# Patient Record
Sex: Female | Born: 1950 | Race: White | Hispanic: No | Smoking: Never smoker
Health system: Southern US, Community
[De-identification: ages and names within clinical notes are randomized; demographics above are authoritative.]

## PROBLEM LIST (undated history)

## (undated) DIAGNOSIS — I10 Essential (primary) hypertension: Secondary | ICD-10-CM

## (undated) DIAGNOSIS — E119 Type 2 diabetes mellitus without complications: Secondary | ICD-10-CM

## (undated) DIAGNOSIS — C7A8 Other malignant neuroendocrine tumors: Secondary | ICD-10-CM

## (undated) DIAGNOSIS — E7800 Pure hypercholesterolemia, unspecified: Secondary | ICD-10-CM

## (undated) DIAGNOSIS — G4730 Sleep apnea, unspecified: Secondary | ICD-10-CM

## (undated) DIAGNOSIS — K589 Irritable bowel syndrome without diarrhea: Secondary | ICD-10-CM

## (undated) HISTORY — PX: TONSILLECTOMY: SUR1361

## (undated) HISTORY — PX: ANKLE FRACTURE SURGERY: SHX122

## (undated) HISTORY — PX: TUBAL LIGATION: SHX77

## (undated) HISTORY — PX: CHOLECYSTECTOMY: SHX55

---

## 2009-08-27 ENCOUNTER — Ambulatory Visit (HOSPITAL_COMMUNITY): Admission: RE | Admit: 2009-08-27 | Discharge: 2009-08-27 | Payer: Self-pay | Admitting: Urology

## 2010-06-07 LAB — GLUCOSE, CAPILLARY: Glucose-Capillary: 171 mg/dL — ABNORMAL HIGH (ref 70–99)

## 2014-01-26 ENCOUNTER — Encounter (HOSPITAL_BASED_OUTPATIENT_CLINIC_OR_DEPARTMENT_OTHER): Payer: Self-pay | Admitting: *Deleted

## 2014-01-26 ENCOUNTER — Emergency Department (HOSPITAL_BASED_OUTPATIENT_CLINIC_OR_DEPARTMENT_OTHER): Payer: Managed Care, Other (non HMO)

## 2014-01-26 ENCOUNTER — Emergency Department (HOSPITAL_BASED_OUTPATIENT_CLINIC_OR_DEPARTMENT_OTHER)
Admission: EM | Admit: 2014-01-26 | Discharge: 2014-01-26 | Disposition: A | Discharge status: Other Acute Inpatient Hospital | Payer: Managed Care, Other (non HMO) | Attending: Emergency Medicine | Admitting: Emergency Medicine

## 2014-01-26 DIAGNOSIS — E780 Pure hypercholesterolemia: Secondary | ICD-10-CM | POA: Insufficient documentation

## 2014-01-26 DIAGNOSIS — K5289 Other specified noninfective gastroenteritis and colitis: Secondary | ICD-10-CM | POA: Insufficient documentation

## 2014-01-26 DIAGNOSIS — Z794 Long term (current) use of insulin: Secondary | ICD-10-CM | POA: Diagnosis not present

## 2014-01-26 DIAGNOSIS — E119 Type 2 diabetes mellitus without complications: Secondary | ICD-10-CM | POA: Diagnosis not present

## 2014-01-26 DIAGNOSIS — Z79899 Other long term (current) drug therapy: Secondary | ICD-10-CM | POA: Insufficient documentation

## 2014-01-26 DIAGNOSIS — I10 Essential (primary) hypertension: Secondary | ICD-10-CM | POA: Insufficient documentation

## 2014-01-26 DIAGNOSIS — R197 Diarrhea, unspecified: Secondary | ICD-10-CM | POA: Diagnosis present

## 2014-01-26 DIAGNOSIS — K529 Noninfective gastroenteritis and colitis, unspecified: Secondary | ICD-10-CM

## 2014-01-26 DIAGNOSIS — R109 Unspecified abdominal pain: Secondary | ICD-10-CM | POA: Insufficient documentation

## 2014-01-26 HISTORY — DX: Type 2 diabetes mellitus without complications: E11.9

## 2014-01-26 HISTORY — DX: Essential (primary) hypertension: I10

## 2014-01-26 HISTORY — DX: Irritable bowel syndrome, unspecified: K58.9

## 2014-01-26 HISTORY — DX: Sleep apnea, unspecified: G47.30

## 2014-01-26 HISTORY — DX: Pure hypercholesterolemia, unspecified: E78.00

## 2014-01-26 LAB — URINE MICROSCOPIC-ADD ON

## 2014-01-26 LAB — CBC WITH DIFFERENTIAL/PLATELET
Basophils Absolute: 0 10*3/uL (ref 0.0–0.1)
Basophils Relative: 0 % (ref 0–1)
EOS ABS: 0 10*3/uL (ref 0.0–0.7)
EOS PCT: 0 % (ref 0–5)
HEMATOCRIT: 38.5 % (ref 36.0–46.0)
Hemoglobin: 13.4 g/dL (ref 12.0–15.0)
Lymphocytes Relative: 8 % — ABNORMAL LOW (ref 12–46)
Lymphs Abs: 2 10*3/uL (ref 0.7–4.0)
MCH: 30.6 pg (ref 26.0–34.0)
MCHC: 34.8 g/dL (ref 30.0–36.0)
MCV: 87.9 fL (ref 78.0–100.0)
MONOS PCT: 8 % (ref 3–12)
Monocytes Absolute: 2.2 10*3/uL — ABNORMAL HIGH (ref 0.1–1.0)
Neutro Abs: 22.3 10*3/uL — ABNORMAL HIGH (ref 1.7–7.7)
Neutrophils Relative %: 84 % — ABNORMAL HIGH (ref 43–77)
PLATELETS: 283 10*3/uL (ref 150–400)
RBC: 4.38 MIL/uL (ref 3.87–5.11)
RDW: 12.8 % (ref 11.5–15.5)
WBC: 26.5 10*3/uL — ABNORMAL HIGH (ref 4.0–10.5)

## 2014-01-26 LAB — URINALYSIS, ROUTINE W REFLEX MICROSCOPIC
BILIRUBIN URINE: NEGATIVE
GLUCOSE, UA: NEGATIVE mg/dL
Ketones, ur: NEGATIVE mg/dL
Nitrite: NEGATIVE
PROTEIN: NEGATIVE mg/dL
SPECIFIC GRAVITY, URINE: 1.008 (ref 1.005–1.030)
UROBILINOGEN UA: 0.2 mg/dL (ref 0.0–1.0)
pH: 5 (ref 5.0–8.0)

## 2014-01-26 LAB — COMPREHENSIVE METABOLIC PANEL
ALK PHOS: 104 U/L (ref 39–117)
ALT: 14 U/L (ref 0–35)
ANION GAP: 15 (ref 5–15)
AST: 17 U/L (ref 0–37)
Albumin: 3.5 g/dL (ref 3.5–5.2)
BILIRUBIN TOTAL: 0.7 mg/dL (ref 0.3–1.2)
BUN: 35 mg/dL — AB (ref 6–23)
CALCIUM: 9.1 mg/dL (ref 8.4–10.5)
CO2: 25 meq/L (ref 19–32)
CREATININE: 1.9 mg/dL — AB (ref 0.50–1.10)
Chloride: 99 mEq/L (ref 96–112)
GFR, EST AFRICAN AMERICAN: 31 mL/min — AB (ref >90)
GFR, EST NON AFRICAN AMERICAN: 27 mL/min — AB (ref >90)
GLUCOSE: 223 mg/dL — AB (ref 70–99)
Potassium: 3.9 mEq/L (ref 3.7–5.3)
SODIUM: 139 meq/L (ref 137–147)
Total Protein: 6.9 g/dL (ref 6.0–8.3)

## 2014-01-26 MED ORDER — SODIUM CHLORIDE 0.9 % IV BOLUS (SEPSIS)
500.0000 mL | Freq: Once | INTRAVENOUS | Status: AC
Start: 2014-01-26 — End: 2014-01-26
  Administered 2014-01-26: 500 mL via INTRAVENOUS

## 2014-01-26 MED ORDER — MORPHINE SULFATE 4 MG/ML IJ SOLN
4.0000 mg | Freq: Once | INTRAMUSCULAR | Status: AC
  Administered 2014-01-26: 4 mg via INTRAVENOUS
  Filled 2014-01-26: qty 1

## 2014-01-26 MED ORDER — CIPROFLOXACIN IN D5W 400 MG/200ML IV SOLN
400.0000 mg | Freq: Once | INTRAVENOUS | Status: DC
  Filled 2014-01-26: qty 200

## 2014-01-26 MED ORDER — METRONIDAZOLE IN NACL 5-0.79 MG/ML-% IV SOLN
500.0000 mg | Freq: Once | INTRAVENOUS | Status: AC
  Administered 2014-01-26: 500 mg via INTRAVENOUS
  Filled 2014-01-26 (×2): qty 100

## 2014-01-26 MED ORDER — ONDANSETRON HCL 4 MG/2ML IJ SOLN
4.0000 mg | Freq: Once | INTRAMUSCULAR | Status: AC
  Administered 2014-01-26: 4 mg via INTRAVENOUS
  Filled 2014-01-26: qty 2

## 2014-01-26 MED ORDER — IOHEXOL 300 MG/ML  SOLN: 50.0000 mL | Freq: Once | INTRAMUSCULAR | Status: DC | PRN

## 2014-01-26 NOTE — ED Provider Notes (Signed)
CSN: [id number]     Arrival date & time 01/26/14  0175 History   First MD Initiated Contact with Patient 01/26/14 1020     Chief Complaint  Patient presents with  . Diarrhea     (Consider location/radiation/quality/duration/timing/severity/associated sxs/prior Treatment) HPI Comments: Patient is a 63 year old female with history of diabetes, hypertension, and incurable bowel. She presents today with complaints of left-sided abdominal cramping for the past 24 hours. She has had several bowel movements yesterday evening which were bloody. She reports having a fever at home. She denies ill contacts.  She tells me she had a colonoscopy less than 2 years ago which revealed that she had diverticulosis. She was advised to go on a diverticulosis friendly diet.  Patient is a 63 y.o. female presenting with diarrhea. The history is provided by the patient.  Diarrhea Quality:  Bloody Severity:  Moderate Onset quality:  Gradual Duration:  2 days Timing:  Constant Progression:  Worsening Relieved by:  Nothing Worsened by:  Nothing tried Ineffective treatments:  None tried Associated symptoms: abdominal pain and fever     Past Medical History  Diagnosis Date  . Irritable bowel syndrome   . Diabetes mellitus without complication   . Hypertension   . Sleep apnea   . High cholesterol    Past Surgical History  Procedure Laterality Date  . Cholecystectomy    . Tonsillectomy    . Tubal ligation    . Ankle fracture surgery     No family history on file. History  Substance Use Topics  . Smoking status: Never Smoker   . Smokeless tobacco: Not on file  . Alcohol Use: No   OB History    No data available     Review of Systems  Constitutional: Positive for fever.  Gastrointestinal: Positive for abdominal pain and diarrhea.  All other systems reviewed and are negative.     Allergies  Review of patient's allergies indicates no known allergies.  Home Medications   Prior to  Admission medications   Medication Sig Start Date End Date Taking? Authorizing Provider  insulin glargine (LANTUS) 100 UNIT/ML injection Inject into the skin at bedtime.   Yes Historical Provider, MD  lisinopril (PRINIVIL,ZESTRIL) 10 MG tablet Take 10 mg by mouth daily.   Yes Historical Provider, MD  simvastatin (ZOCOR) 10 MG tablet Take 10 mg by mouth daily.   Yes Historical Provider, MD  verapamil (CALAN) 120 MG tablet Take 120 mg by mouth 3 (three) times daily.   Yes Historical Provider, MD   BP 135/70 mmHg  Pulse 107  Temp(Src) 99 F (37.2 C) (Oral)  Resp 20  Ht 5\' 6"  (1.676 m)  Wt 230 lb (104.327 kg)  BMI 37.14 kg/m2  SpO2 97% Physical Exam  Constitutional: She is oriented to person, place, and time. She appears well-developed and well-nourished. No distress.  HENT:  Head: Normocephalic and atraumatic.  Neck: Normal range of motion. Neck supple.  Cardiovascular: Normal rate and regular rhythm.  Exam reveals no gallop and no friction rub.   No murmur heard. Pulmonary/Chest: Effort normal and breath sounds normal. No respiratory distress. She has no wheezes.  Abdominal: Soft. Bowel sounds are normal. She exhibits no distension. There is tenderness. There is no rebound and no guarding.  There is tenderness to palpation in the left lower quadrant and left midabdomen. There is no rebound and no guarding.  Musculoskeletal: Normal range of motion.  Neurological: She is alert and oriented to person, place, and time.  Skin: Skin is warm and dry. She is not diaphoretic.  Nursing note and vitals reviewed.   ED Course  Procedures (including critical care time) Labs Review Labs Reviewed  COMPREHENSIVE METABOLIC PANEL  CBC WITH DIFFERENTIAL  URINALYSIS, ROUTINE W REFLEX MICROSCOPIC    Imaging Review No results found.   Date: 01/27/2014  Rate: 73  Rhythm: sinus  QRS Axis: normal  Intervals: normal  ST/T Wave abnormalities: none  Conduction Disutrbances:none  Narrative  Interpretation:   Old EKG Reviewed: none available    MDM   Final diagnoses:  None    Patient presents with left sided abd pain and bloody stool.  Workup reveals an elevated wbc of 26k and ct shows colitis of the descending and sigmoid colon.  I feel these findings and clinical situation require iv antibiotics and admission.  The patient is requesting admission to [facility].  I have spoken with Dr. Doyne Keel who agrees to accept the patient in transfer.  I will order blood cultures, give cipro and flagyl, and transfer to [facility].    Veryl Speak, MD 01/27/14 [phone]

## 2014-01-26 NOTE — ED Notes (Signed)
Patient states last night began having abd pain with diarrhea. States that there was bright red blood present when she had a BM

## 2014-02-01 LAB — CULTURE, BLOOD (ROUTINE X 2)
Culture: NO GROWTH
Culture: NO GROWTH

## 2014-05-13 NOTE — Progress Notes (Signed)
Patient ID: Kristy Rios, female   DOB: Sep 15, 1950, 64 y.o.   MRN: [id number]     Cardiology Office Note   Date:  05/13/2014   ID:  SHANEDRA LAVE, DOB [birth year], MRN [id number]  PCP:  No primary care provider on file.  Cardiologist:   Jenkins Rouge, MD   No chief complaint on file.     History of Present Illness: Kristy Rios is a 64 y.o. female who presents for evaluation of ? Thrombosis seen on CT scan. Seen in ER at [facility] 01/26/14 with LLQ pain and CT showing IBS With elevated WBC  Transferred to [facility] hospital at patient request.  CT abdomen indicated minimal calcified plaque in the decending abdominal aorta Noted lower lobe Pulmonary nodules  She indicates she has none about these nodules since 2004.  They have been stable  Subsequently diagnosed with carcinoid.  Recently evaluated At [facility] records .  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  Also noted intracardiac filling defect ? Thrombus ? Enhancement ? Tumor involvement.  Present in November not FDG avid.    She has no dyspnea palpitations history of right heart failure flusshing or edema.  Prior to CT GFR 48 and has had diuretic held the last 5 days.  A1c 6.6      Past Medical History  Diagnosis Date  . Irritable bowel syndrome   . Diabetes mellitus without complication   . Hypertension   . Sleep apnea   . High cholesterol     Past Surgical History  Procedure Laterality Date  . Cholecystectomy    . Tonsillectomy    . Tubal ligation    . Ankle fracture surgery       Current Outpatient Prescriptions  Medication Sig Dispense Refill  . insulin glargine (LANTUS) 100 UNIT/ML injection Inject into the skin at bedtime.    Marland Kitchen lisinopril (PRINIVIL,ZESTRIL) 10 MG tablet Take 10 mg by mouth daily.    . simvastatin (ZOCOR) 10 MG tablet Take 10 mg by mouth daily.    . verapamil (CALAN) 120 MG tablet Take 120 mg by mouth 3 (three) times daily.     No  current facility-administered medications for this visit.    Allergies:   Review of patient's allergies indicates no known allergies.    Social History:  The patient  reports that she has never smoked. She does not have any smokeless tobacco history on file. She reports that she does not drink alcohol.   Family History:  The patient's family history is not on file.    ROS:  Please see the history of present illness.   Otherwise, review of systems are positive for none.   All other systems are reviewed and negative.    PHYSICAL EXAM: VS:  There were no vitals taken for this visit. , BMI There is no weight on file to calculate BMI. Obese white female  HEENT: normal Neck: no JVD, carotid bruits, or masses Cardiac:  RRR; no murmurs, rubs, or gallops,no edema  Respiratory:  clear to auscultation bilaterally, normal work of breathing GI: soft, nontender, nondistended, + BS MS: no deformity or atrophy Skin: warm and dry, no rash Neuro:  Strength and sensation are intact Psych: euthymic mood, full affect   EKG:  Report MD Ouida Sills NSR    Recent Labs: 01/26/2014: ALT 14; BUN 35*; Creatinine 1.90*; Hemoglobin 13.4; Platelets 283; Potassium 3.9; Sodium 139    Lipid Panel No results found  for: CHOL, TRIG, HDL, CHOLHDL, VLDL, LDLCALC, LDLDIRECT    Wt Readings from Last 3 Encounters:  01/26/14 104.327 kg (230 lb)      Other studies Reviewed: Additional studies/ records that were reviewed today include: Fax records [facility] Sills and EPIC notes.    ASSESSMENT AND PLAN:  1.  Cardiac Mass:  Need to characterize as Rx of thrombus would involve anticoagulation.  Agree that cardiac MRI would be best test.  Suspect her Cr will be ok since holding diuretic but will check BMET as need gadolinium to evaluate carcinoid and R/O apical thrombus.  Depending on scan may have to also consider echo if tricuspid valve abnormal.   2. HTN:  Continue ace and calcium blocker hold diuretic 3.  Pulmonary  carcinoid surveillance imaging per oncology 4. Chol;  On statin   5. DM  On lantus  A1c under 7 low carb diet and weight loss discussed    Current medicines are reviewed at length with the patient today.  The patient does not have concerns regarding medicines.  The following changes have been made:  no change  Labs/ tests ordered today include:  Cardiac MRI  BMET   No orders of the defined types were placed in this encounter.     Disposition:   FU with me after MRI  i    Signed, Jenkins Rouge, MD  05/13/2014 9:09 AM    [facility] [address], [place], [place]  [address] Phone: [phone]; Fax: [phone]

## 2014-05-14 ENCOUNTER — Ambulatory Visit (INDEPENDENT_AMBULATORY_CARE_PROVIDER_SITE_OTHER): Payer: Managed Care, Other (non HMO) | Admitting: Cardiovascular Disease

## 2014-05-14 ENCOUNTER — Telehealth: Payer: Self-pay | Admitting: Cardiovascular Disease

## 2014-05-14 ENCOUNTER — Encounter: Payer: Self-pay | Admitting: Cardiovascular Disease

## 2014-05-14 VITALS — BP 140/70 | HR 87 | Ht 66.0 in | Wt 231.8 lb

## 2014-05-14 DIAGNOSIS — I7410 Embolism and thrombosis of unspecified parts of aorta: Secondary | ICD-10-CM

## 2014-05-14 DIAGNOSIS — Z79899 Other long term (current) drug therapy: Secondary | ICD-10-CM

## 2014-05-14 LAB — BASIC METABOLIC PANEL
BUN: 22 mg/dL (ref 6–23)
CHLORIDE: 104 meq/L (ref 96–112)
CO2: 29 mEq/L (ref 19–32)
Calcium: 9.3 mg/dL (ref 8.4–10.5)
Creatinine, Ser: 1.17 mg/dL (ref 0.40–1.20)
GFR: 49.58 mL/min — AB (ref >60.00)
GLUCOSE: 152 mg/dL — AB (ref 70–99)
POTASSIUM: 3.8 meq/L (ref 3.5–5.1)
SODIUM: 138 meq/L (ref 135–145)

## 2014-05-14 NOTE — Telephone Encounter (Signed)
New message     Pt want the cpt code for the MRI Dr Johnsie Cancel want her to have. (order is in computer)  This is for her ins company so that she will know how much they will pay.  They want her to go to [place] to have this test.

## 2014-05-14 NOTE — Patient Instructions (Signed)
Your physician recommends that you schedule a follow-up appointment in: NEXT  AVAILABLE  WITH DR [facility]  Your physician has recommended you make the following change in your medication:  [place] Your physician recommends that you return for lab work in: BMET  [place] has requested that you have a cardiac MRI. Cardiac MRI uses a computer to create images of your heart as its beating, producing both still and moving pictures of your heart and major blood vessels. For further information please visit [email or web address] Please follow the instruction sheet given to you today for more information. Monday

## 2014-05-14 NOTE — Addendum Note (Signed)
Addended by: Devra Dopp E on: 05/14/2014 11:32 AM   Modules accepted: Orders

## 2014-05-15 ENCOUNTER — Ambulatory Visit (HOSPITAL_COMMUNITY)
Admission: RE | Admit: 2014-05-15 | Discharge: 2014-05-15 | Disposition: A | Discharge status: Home / Self Care | Payer: Managed Care, Other (non HMO) | Source: Ambulatory Visit | Attending: Cardiovascular Disease | Admitting: Cardiovascular Disease

## 2014-05-15 ENCOUNTER — Other Ambulatory Visit: Payer: Self-pay | Admitting: Cardiovascular Disease

## 2014-05-15 ENCOUNTER — Ambulatory Visit (HOSPITAL_COMMUNITY): Payer: Managed Care, Other (non HMO)

## 2014-05-15 DIAGNOSIS — I7410 Embolism and thrombosis of unspecified parts of aorta: Secondary | ICD-10-CM

## 2014-05-15 DIAGNOSIS — Z87898 Personal history of other specified conditions: Secondary | ICD-10-CM | POA: Diagnosis not present

## 2014-05-15 DIAGNOSIS — R931 Abnormal findings on diagnostic imaging of heart and coronary circulation: Secondary | ICD-10-CM | POA: Insufficient documentation

## 2014-05-15 MED ORDER — LORAZEPAM 2 MG/ML IJ SOLN
INTRAMUSCULAR | Status: AC
  Filled 2014-05-15: qty 1

## 2014-05-15 MED ORDER — LORAZEPAM 2 MG/ML IJ SOLN
2.0000 mg | Freq: Once | INTRAMUSCULAR | Status: AC
  Administered 2014-05-15: 2 mg via INTRAVENOUS
  Filled 2014-05-15: qty 1

## 2014-05-15 MED ORDER — GADOBENATE DIMEGLUMINE 529 MG/ML IV SOLN
40.0000 mL | Freq: Once | INTRAVENOUS | Status: AC
Start: 2014-05-15 — End: 2014-05-15
  Administered 2014-05-15: 40 mL via INTRAVENOUS

## 2014-05-15 MED ORDER — LORAZEPAM 2 MG/ML IJ SOLN: 2.0000 mg | Freq: Once | INTRAMUSCULAR | Status: AC

## 2014-05-15 NOTE — Telephone Encounter (Signed)
Spoke w/pt and gave CPT codes [phone] and [phone].  Pt wants to go [facility] for her MRI/MRA.  [employer] had recommended [place], however; cardiac MRI can't be done there.  Equipment needed to cardiac MRI only available at [facility] in [place]. Pt understands and agrees.

## 2014-05-21 ENCOUNTER — Encounter: Payer: Self-pay | Admitting: Cardiovascular Disease

## 2014-06-08 NOTE — Progress Notes (Signed)
Patient ID: Kristy Rios, female   DOB: 09-05-1950, 64 y.o.   MRN: [id number]     Cardiology Office Note   Date:  06/09/2014   ID:  AHTZIRI JEFFRIES, DOB 12/09/1950, MRN [id number]  PCP:  Maylon Peppers, MD  Cardiologist:   Jenkins Rouge, MD   Chief Complaint  Patient presents with  . Follow-up    aortic thrombus      History of Present Illness: Kristy Rios is a 64 y.o. female who presents for evaluation of ? Thrombosis seen on CT scan. Seen in ER at [facility] 01/26/14 with LLQ pain and CT showing IBS With elevated WBC  Transferred to [facility] hospital at patient request.  CT abdomen indicated minimal calcified plaque in the decending abdominal aorta Noted lower lobe Pulmonary nodules  She indicates she has none about these nodules since 2004.  They have been stable  Subsequently diagnosed with carcinoid.  Recently evaluated At [facility] records .  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  Also noted intracardiac filling defect ? Thrombus ? Enhancement ? Tumor involvement.  Present in November not FDG avid.    She has no dyspnea palpitations history of right heart failure flusshing or edema.  Prior to CT GFR 48 and has had diuretic held the last 5 days.  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 Irritable bowel syndrome   . Diabetes mellitus without complication   .  Hypertension   . Sleep apnea   . High cholesterol     Past Surgical History  Procedure Laterality Date  . Cholecystectomy    . Tonsillectomy    . Tubal ligation    . Ankle fracture surgery       Current Outpatient Prescriptions  Medication Sig Dispense Refill  . albuterol (PROVENTIL HFA;VENTOLIN HFA) 108 (90 BASE) MCG/ACT inhaler Inhale into the lungs every 6 (six) hours as needed for wheezing or shortness of breath.    . ALPRAZolam (XANAX) 0.5 MG tablet Take 0.5 mg by mouth at bedtime as needed for anxiety.    . bumetanide (BUMEX) 1 MG tablet Take 1 mg by mouth daily.    . clotrimazole-betamethasone (LOTRISONE) cream Apply 1 application topically 3 (three) times daily.    . cyclobenzaprine (FLEXERIL) 10 MG tablet Take 10 mg by mouth 3 (three) times daily as needed for muscle spasms.    . fexofenadine (ALLEGRA) 60 MG tablet Take 60 mg by mouth daily.    Marland Kitchen glucose blood test strip TEST BLOOD SUGAR BEFORE EACH MEAL AND 2 HOURS AFTER ONE MEAL DURING THE DAY AND EVERY NIGHT AT BEDTIME    . hyoscyamine (LEVSIN, ANASPAZ) 0.125 MG tablet Take 0.125 mg by mouth every 4 (four) hours as needed (nausea).    . insulin glargine (LANTUS) 100 UNIT/ML injection Inject into the skin at bedtime.    Marland Kitchen  Liraglutide 18 MG/3ML SOPN Inject 1.8 mg into the skin daily.    Marland Kitchen lisinopril (PRINIVIL,ZESTRIL) 40 MG tablet Take 40 mg by mouth daily.    . mometasone (NASONEX) 50 MCG/ACT nasal spray Place 2 sprays into the nose daily.    . simvastatin (ZOCOR) 40 MG tablet Take 40 mg by mouth daily.    . traZODone (DESYREL) 100 MG tablet Take 100 mg by mouth at bedtime.    . verapamil (VERELAN PM) 240 MG 24 hr capsule Take 240 mg by mouth daily.    Marland Kitchen zolpidem (AMBIEN) 5 MG tablet Take 5 mg by mouth at bedtime as needed for sleep.     No current facility-administered medications for this visit.   Facility-Administered Medications Ordered in Other Visits  Medication Dose Route Frequency Provider Last Rate Last Dose    . LORazepam (ATIVAN) injection 2 mg  2 mg Intravenous Once Josue Hector, MD        Allergies:   Review of patient's allergies indicates no known allergies.    Social History:  The patient  reports that she has never smoked. She does not have any smokeless tobacco history on file. She reports that she does not drink alcohol.   Family History:  The patient's family history includes Cancer in her maternal grandfather; Diabetes in her mother; Heart disease in her father; Hyperlipidemia in her father; Hypertension in her mother; Kidney cancer in her maternal grandmother; Sleep apnea in her mother.    ROS:  Please see the history of present illness.   Otherwise, review of systems are positive for none.   All other systems are reviewed and negative.    PHYSICAL EXAM: VS:  BP 130/78 mmHg  Pulse 78  Ht 5\' 6"  (1.676 m)  Wt 236 lb (107.049 kg)  BMI 38.11 kg/m2  SpO2 98% , BMI Body mass index is 38.11 kg/(m^2). Obese white female  HEENT: normal Neck: no JVD, carotid bruits, or masses Cardiac:  RRR; no murmurs, rubs, or gallops,no edema  Respiratory:  clear to auscultation bilaterally, normal work of breathing GI: soft, nontender, nondistended, + BS MS: no deformity or atrophy Skin: warm and dry, no rash Neuro:  Strength and sensation are intact Psych: euthymic mood, full affect   EKG:  Report MD Ouida Sills NSR    Recent Labs: 01/26/2014: ALT 14; Hemoglobin 13.4; Platelets 283 05/14/2014: BUN 22; Creatinine 1.17; Potassium 3.8; Sodium 138    Lipid Panel No results found for: CHOL, TRIG, HDL, CHOLHDL, VLDL, LDLCALC, LDLDIRECT    Wt Readings from Last 3 Encounters:  06/09/14 236 lb (107.049 kg)  05/14/14 231 lb 12.8 oz (105.144 kg)  01/26/14 230 lb (104.327 kg)      Other studies Reviewed: Additional studies/ records that were reviewed today include: Fax records MD Ou[facility] Sills and EPIC notes.    ASSESSMENT AND PLAN:  1.  Cardiac Mass:  None seen by MRI with no evidence of  cardiac carcinoid 2. HTN:  Continue ace and calcium blocker hold diuretic 3.  Pulmonary carcinoid surveillance imaging per oncology 4. Chol;  On statin   5. DM  On lantus  A1c under 7 low carb diet and weight loss discussed    Current medicines are reviewed at length with the patient today.  The patient does not have concerns regarding medicines.  The following changes have been made:  no change  Labs/ tests ordered today include:    No orders of the defined types were placed in this encounter.  Disposition:   FU with me  6 months    Signed, Jenkins Rouge, MD  06/09/2014 4:11 PM    [facility] [address], [place], [place]  [address] Phone: [phone]; Fax: [phone]

## 2014-06-09 ENCOUNTER — Ambulatory Visit (INDEPENDENT_AMBULATORY_CARE_PROVIDER_SITE_OTHER): Payer: Managed Care, Other (non HMO) | Admitting: Cardiovascular Disease

## 2014-06-09 ENCOUNTER — Encounter: Payer: Self-pay | Admitting: Cardiovascular Disease

## 2014-06-09 VITALS — BP 130/78 | HR 78 | Ht 66.0 in | Wt 236.0 lb

## 2014-06-09 DIAGNOSIS — C7A090 Malignant carcinoid tumor of the bronchus and lung: Secondary | ICD-10-CM | POA: Insufficient documentation

## 2014-06-09 DIAGNOSIS — E119 Type 2 diabetes mellitus without complications: Secondary | ICD-10-CM | POA: Insufficient documentation

## 2014-06-09 DIAGNOSIS — K559 Vascular disorder of intestine, unspecified: Secondary | ICD-10-CM

## 2014-06-09 DIAGNOSIS — C3490 Malignant neoplasm of unspecified part of unspecified bronchus or lung: Secondary | ICD-10-CM

## 2014-06-09 DIAGNOSIS — I10 Essential (primary) hypertension: Secondary | ICD-10-CM | POA: Insufficient documentation

## 2014-06-09 NOTE — Assessment & Plan Note (Signed)
Has f/u at [facility] with CT  Cardiac MRI showed no cardiac involvement

## 2014-06-09 NOTE — Assessment & Plan Note (Signed)
Discussed low carb diet.  Target hemoglobin A1c is 6.5 or less.  Continue current medications.  

## 2014-06-09 NOTE — Patient Instructions (Signed)
Your physician wants you to follow-up in:   Kristy Rios will receive a reminder letter in the mail two months in advance. If you don't receive a letter, please call our office to schedule the follow-up appointment.  Your physician recommends that you continue on your current medications as directed. Please refer to the Current Medication list given to you today.  Your physician has requested that you have an abdominal aorta duplex. During this test, an ultrasound is used to evaluate the aorta. Allow 30 minutes for this exam. Do not eat after midnight the day before and avoid carbonated beverages

## 2014-06-23 ENCOUNTER — Encounter (HOSPITAL_COMMUNITY): Payer: Managed Care, Other (non HMO)

## 2014-06-25 ENCOUNTER — Other Ambulatory Visit: Payer: Self-pay | Admitting: *Deleted

## 2014-06-25 ENCOUNTER — Other Ambulatory Visit (HOSPITAL_COMMUNITY): Payer: Self-pay | Admitting: Cardiology

## 2014-06-25 DIAGNOSIS — R1013 Epigastric pain: Secondary | ICD-10-CM

## 2014-06-25 DIAGNOSIS — R634 Abnormal weight loss: Secondary | ICD-10-CM

## 2014-06-25 DIAGNOSIS — K559 Vascular disorder of intestine, unspecified: Secondary | ICD-10-CM

## 2014-06-27 ENCOUNTER — Encounter (HOSPITAL_COMMUNITY): Payer: Managed Care, Other (non HMO)

## 2014-07-07 ENCOUNTER — Encounter (HOSPITAL_COMMUNITY): Payer: Managed Care, Other (non HMO)

## 2014-07-18 ENCOUNTER — Ambulatory Visit (HOSPITAL_COMMUNITY): Payer: Managed Care, Other (non HMO) | Attending: Cardiovascular Disease | Admitting: Cardiology

## 2014-07-18 DIAGNOSIS — R634 Abnormal weight loss: Secondary | ICD-10-CM | POA: Insufficient documentation

## 2014-07-18 DIAGNOSIS — R1013 Epigastric pain: Secondary | ICD-10-CM | POA: Diagnosis not present

## 2014-07-18 DIAGNOSIS — K559 Vascular disorder of intestine, unspecified: Secondary | ICD-10-CM | POA: Diagnosis not present

## 2014-07-18 NOTE — Progress Notes (Signed)
Mesenteric arterial duplex in the fasting and post-prandial state.

## 2015-01-25 NOTE — Progress Notes (Signed)
Patient ID: Kristy Rios, female   DOB: [birth year], 64 y.o.   MRN: [id number]     Cardiology Office Note   Date:  01/25/2015   ID:  Kristy Rios, DOB [birth year], MRN [id number]  PCP:  Maylon Peppers, MD  Cardiologist:   Jenkins Rouge, MD   No chief complaint on file.     History of Present Illness: Kristy Rios is a 64 y.o. female  F/u HTN and pulmonary carcinoid . Seen in ER at [facility] 01/26/14 with LLQ pain and CT showing IBS With elevated WBC  Transferred to [facility] hospital at patient request.  CT abdomen indicated minimal calcified plaque in the decending abdominal aorta Noted lower lobe Pulmonary nodules  She indicates she has none about these nodules since 2004.  They have been stable  Subsequently diagnosed with carcinoid.  Recently evaluated At [facility] records .  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  Also noted intracardiac filling defect ? Thrombus ? Enhancement ? Tumor involvement.  Present in November not FDG avid.    She has no dyspnea palpitations history of right heart failure flusshing or edema.  Prior to CT GFR 48 and has had diuretic held the last 5 days.  A1c 6.6    F/U MRI reviewed and no evidence of cardiac tumor 05/15/14  IMPRESSION: 1) Normal LV size and function EF 72%  2) No LV mass or thrombus normal delayed enhancement gadolinium images with normal and long TI times  3) Normal cardiac valves with no evidence of carcinoid involvement of right heart valves  4) Prominent epicardial fat tissue suppresses with IIR T1 weighted images  5) Bovine aortic arch with normal aortic root and no coarctation  6) Prominent azygous vein with no discontinuity in IVC noted  7) No LAA thrombus  Doyle Askew an injured her left elbow.wrist in splint.    Past Medical History  Diagnosis Date  . Irritable bowel syndrome   . Diabetes mellitus without complication   . Hypertension   .  Sleep apnea   . High cholesterol     Past Surgical History  Procedure Laterality Date  . Cholecystectomy    . Tonsillectomy    . Tubal ligation    . Ankle fracture surgery       Current Outpatient Prescriptions  Medication Sig Dispense Refill  . albuterol (PROVENTIL HFA;VENTOLIN HFA) 108 (90 BASE) MCG/ACT inhaler Inhale into the lungs every 6 (six) hours as needed for wheezing or shortness of breath.    . ALPRAZolam (XANAX) 0.5 MG tablet Take 0.5 mg by mouth at bedtime as needed for anxiety.    . bumetanide (BUMEX) 1 MG tablet Take 1 mg by mouth daily.    . clotrimazole-betamethasone (LOTRISONE) cream Apply 1 application topically 3 (three) times daily.    . cyclobenzaprine (FLEXERIL) 10 MG tablet Take 10 mg by mouth 3 (three) times daily as needed for muscle spasms.    . fexofenadine (ALLEGRA) 60 MG tablet Take 60 mg by mouth daily.    Marland Kitchen glucose blood test strip TEST BLOOD SUGAR BEFORE EACH MEAL AND 2 HOURS AFTER ONE MEAL DURING THE DAY AND EVERY NIGHT AT BEDTIME    . hyoscyamine (LEVSIN, ANASPAZ) 0.125 MG tablet Take 0.125 mg by mouth every 4 (four) hours as needed (nausea).    . insulin glargine (LANTUS) 100 UNIT/ML injection Inject into the skin at bedtime.    . Liraglutide 18 MG/3ML  SOPN Inject 1.8 mg into the skin daily.    Marland Kitchen lisinopril (PRINIVIL,ZESTRIL) 40 MG tablet Take 40 mg by mouth daily.    . mometasone (NASONEX) 50 MCG/ACT nasal spray Place 2 sprays into the nose daily.    . simvastatin (ZOCOR) 40 MG tablet Take 40 mg by mouth daily.    . traZODone (DESYREL) 100 MG tablet Take 100 mg by mouth at bedtime.    . verapamil (VERELAN PM) 240 MG 24 hr capsule Take 240 mg by mouth daily.    Marland Kitchen zolpidem (AMBIEN) 5 MG tablet Take 5 mg by mouth at bedtime as needed for sleep.     No current facility-administered medications for this visit.   Facility-Administered Medications Ordered in Other Visits  Medication Dose Route Frequency Provider Last Rate Last Dose  . LORazepam  (ATIVAN) injection 2 mg  2 mg Intravenous Once Josue Hector, MD        Allergies:   Review of patient's allergies indicates no known allergies.    Social History:  The patient  reports that she has never smoked. She does not have any smokeless tobacco history on file. She reports that she does not drink alcohol.   Family History:  The patient's family history includes Cancer in her maternal grandfather; Diabetes in her mother; Heart disease in her father; Hyperlipidemia in her father; Hypertension in her mother; Kidney cancer in her maternal grandmother; Sleep apnea in her mother.    ROS:  Please see the history of present illness.   Otherwise, review of systems are positive for none.   All other systems are reviewed and negative.    PHYSICAL EXAM: VS:  There were no vitals taken for this visit. , BMI There is no weight on file to calculate BMI. Obese white female  HEENT: normal Neck: no JVD, carotid bruits, or masses Cardiac:  RRR; no murmurs, rubs, or gallops,no edema  Respiratory:  clear to auscultation bilaterally, normal work of breathing GI: soft, nontender, nondistended, + BS MS: no deformity or atrophy Skin: warm and dry, no rash Neuro:  Strength and sensation are intact Psych: euthymic mood, full affect   EKG:  Report [facility] NSR  01/27/14  NSR rate 71 normal  01/27/15  SR rate 66 normal ECG    Recent Labs: 01/26/2014: ALT 14; Hemoglobin 13.4; Platelets 283 05/14/2014: BUN 22; Creatinine, Ser 1.17; Potassium 3.8; Sodium 138    Lipid Panel No results found for: CHOL, TRIG, HDL, CHOLHDL, VLDL, LDLCALC, LDLDIRECT    Wt Readings from Last 3 Encounters:  06/09/14 107.049 kg (236 lb)  05/14/14 105.144 kg (231 lb 12.8 oz)  01/26/14 104.327 kg (230 lb)      Other studies Reviewed: Additional studies/ records that were reviewed today include: Fax records [facility] and EPIC notes.    ASSESSMENT AND PLAN:  1.  Cardiac Mass:  None seen by MRI with no evidence of  cardiac carcinoid 2. HTN:  Continue ace and calcium blocker hold diuretic 3.  Pulmonary carcinoid surveillance imaging per oncology 4. Chol;  On statin   5. DM  On lantus  A1c under 7 low carb diet and weight loss discussed  6. Ortho:  Splint left wrist f/u orhto avoid NSAI's for pain given BP  Current medicines are reviewed at length with the patient today.  The patient does not have concerns regarding medicines.  The following changes have been made:  no change  Labs/ tests ordered today include:    No  orders of the defined types were placed in this encounter.     Disposition:   FU with me  In a year     Signed, Jenkins Rouge, MD  01/25/2015 2:49 PM    [address], [place], [place]  [address] Phone: [phone]; Fax: [phone]

## 2015-01-27 ENCOUNTER — Ambulatory Visit (INDEPENDENT_AMBULATORY_CARE_PROVIDER_SITE_OTHER): Payer: Managed Care, Other (non HMO) | Admitting: Cardiovascular Disease

## 2015-01-27 ENCOUNTER — Encounter: Payer: Self-pay | Admitting: Cardiovascular Disease

## 2015-01-27 VITALS — BP 144/86 | HR 66 | Ht 66.5 in | Wt 249.2 lb

## 2015-01-27 DIAGNOSIS — I10 Essential (primary) hypertension: Secondary | ICD-10-CM | POA: Diagnosis not present

## 2015-01-27 NOTE — Patient Instructions (Addendum)

## 2016-02-17 NOTE — Progress Notes (Signed)
Patient ID: Kristy Rios, female   DOB: 1950-12-07, 65 y.o.   MRN: [id number]     Cardiology Office Note   Date:  02/18/2016   ID:  Kristy Rios, DOB [birth year], MRN [id number]  PCP:  Maylon Peppers, MD  Cardiologist:   Jenkins Rouge, MD   Chief Complaint  Patient presents with  . Hypertension      History of Present Illness: Kristy Rios is a 65 y.o. female  F/u HTN and pulmonary carcinoid . Seen in ER at [facility] 01/26/14 with LLQ pain and CT showing IBS With elevated WBC  Transferred to [facility] hospital at patient request.  CT abdomen indicated minimal calcified plaque in the decending abdominal aorta Noted lower lobe Pulmonary nodules  She indicates she has none about these nodules since 2004.  They have been stable  Subsequently diagnosed with carcinoid.  Recently evaluated At [facility] records .  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  Also noted intracardiac filling defect ? Thrombus ? Enhancement ? Tumor involvement.  Present in November not FDG avid.    She has no dyspnea palpitations history of right heart failure flusshing or edema.   F/U MRI reviewed and no evidence of cardiac tumor 05/15/14  IMPRESSION: 1) Normal LV size and function EF 72%  2) No LV mass or thrombus normal delayed enhancement gadolinium images with normal and long TI times  3) Normal cardiac valves with no evidence of carcinoid involvement of right heart valves  4) Prominent epicardial fat tissue suppresses with IIR T1 weighted images  5) Bovine aortic arch with normal aortic root and no coarctation  6) Prominent azygous vein with no discontinuity in IVC noted  7) No LAA thrombus  Jenkins Rouge   Has some dependant LE edema started years ago when she broke both ankles at same time Bumex helps BS been high and victoza added     Past Medical History:  Diagnosis Date  . Diabetes mellitus without complication ([place])   .  High cholesterol   . Hypertension   . Irritable bowel syndrome   . Sleep apnea     Past Surgical History:  Procedure Laterality Date  . ANKLE FRACTURE SURGERY    . CHOLECYSTECTOMY    . TONSILLECTOMY    . TUBAL LIGATION       Current Outpatient Prescriptions  Medication Sig Dispense Refill  . ALPRAZolam (XANAX) 0.5 MG tablet Take 0.5 mg by mouth at bedtime as needed for anxiety.    . bumetanide (BUMEX) 1 MG tablet Take 1 mg by mouth daily.    . clotrimazole-betamethasone (LOTRISONE) cream Apply 1 application topically 3 (three) times daily.    . fexofenadine (ALLEGRA) 60 MG tablet Take 60 mg by mouth daily.    Marland Kitchen glucose blood test strip TEST BLOOD SUGAR BEFORE EACH MEAL AND 2 HOURS AFTER ONE MEAL DURING THE DAY AND EVERY NIGHT AT BEDTIME    . hyoscyamine (LEVSIN, ANASPAZ) 0.125 MG tablet Take 0.125 mg by mouth every 4 (four) hours as needed (nausea).    . insulin glargine (LANTUS) 100 UNIT/ML injection Inject 70 Units into the skin 2 (two) times daily.    . Liraglutide 18 MG/3ML SOPN Inject 1.8 mg into the skin daily.    Marland Kitchen lisinopril (PRINIVIL,ZESTRIL) 40 MG tablet Take 40 mg by mouth daily.    . mometasone (NASONEX) 50 MCG/ACT nasal spray Place 2 sprays into the nose daily.    Marland Kitchen  simvastatin (ZOCOR) 40 MG tablet Take 40 mg by mouth daily.    . traZODone (DESYREL) 100 MG tablet Take 100 mg by mouth at bedtime.    . verapamil (VERELAN PM) 240 MG 24 hr capsule Take 240 mg by mouth daily.    Marland Kitchen zolpidem (AMBIEN) 5 MG tablet Take 5 mg by mouth at bedtime as needed for sleep.     No current facility-administered medications for this visit.    Facility-Administered Medications Ordered in Other Visits  Medication Dose Route Frequency Provider Last Rate Last Dose  . LORazepam (ATIVAN) injection 2 mg  2 mg Intravenous Once Josue Hector, MD        Allergies:   Patient has no known allergies.    Social History:  The patient  reports that she has never smoked. She has never used  smokeless tobacco. She reports that she does not drink alcohol.   Family History:  The patient's family history includes Cancer in her maternal grandfather; Diabetes in her mother; Healthy in her brother and brother; Heart disease in her father; Hyperlipidemia in her father; Hypertension in her mother; Kidney cancer in her maternal grandmother; Sleep apnea in her mother.    ROS:  Please see the history of present illness.   Otherwise, review of systems are positive for none.   All other systems are reviewed and negative.    PHYSICAL EXAM: VS:  BP 130/80   Pulse 85   Ht 5' 6.5" (1.689 m)   Wt 110.9 kg (244 lb 6.4 oz)   SpO2 97%   BMI 38.86 kg/m  , BMI Body mass index is 38.86 kg/m. Obese white female   HEENT: normal  Neck: no JVD, carotid bruits, or masses Cardiac:  RRR; no murmurs, rubs, or gallops,no edema  Respiratory:  clear to auscultation bilaterally, normal work of breathing GI: soft, nontender, nondistended, + BS MS: no deformity or atrophy  Skin: warm and dry, no rash Neuro:  Strength and sensation are intact Psych: euthymic mood, full affect   EKG:  Report MD Ou[facility] Sills NSR  01/27/14  NSR rate 71 normal  01/27/15  SR rate 66 normal ECG  02/18/16 SR rate 78 insignificant q waves inferior lateral leads   Recent Labs: No results found for requested labs within last 8760 hours.    Lipid Panel No results found for: CHOL, TRIG, HDL, CHOLHDL, VLDL, LDLCALC, LDLDIRECT    Wt Readings from Last 3 Encounters:  02/18/16 110.9 kg (244 lb 6.4 oz)  01/27/15 113 kg (249 lb 3.2 oz)  06/09/14 107 kg (236 lb)      Other studies Reviewed: Additional studies/ records that were reviewed today include: Fax records [facility] and EPIC notes.    ASSESSMENT AND PLAN:  1.  Cardiac Mass:  None seen by MRI with no evidence of cardiac carcinoid 2. HTN:  Continue ace and  diuretic 3.  Pulmonary carcinoid surveillance imaging per oncology 4. Chol;  On statin   5. DM  On lantus  A1c  under 7 low carb diet and weight loss discussed  6. Ortho:  Splint left wrist f/u orhto avoid NSAI's for pain given BP 7. Edema: related to obesity and previous broken ankles suggested she take bumex in afternoon and can take bid if needed   Current medicines are reviewed at length with the patient today.  The patient does not have concerns regarding medicines.  The following changes have been made:  no change  Labs/ tests ordered today  include:     Orders Placed This Encounter  Procedures  . EKG 12-Lead     Disposition:   FU with me  In a year     Signed, Jenkins Rouge, MD  02/18/2016 2:46 PM    [facility] [address], [place], [place]  [address] Phone: [phone]; Fax: [phone]

## 2016-02-18 ENCOUNTER — Encounter (INDEPENDENT_AMBULATORY_CARE_PROVIDER_SITE_OTHER): Payer: Self-pay

## 2016-02-18 ENCOUNTER — Ambulatory Visit (INDEPENDENT_AMBULATORY_CARE_PROVIDER_SITE_OTHER): Payer: Managed Care, Other (non HMO) | Admitting: Cardiovascular Disease

## 2016-02-18 ENCOUNTER — Encounter: Payer: Self-pay | Admitting: Cardiovascular Disease

## 2016-02-18 VITALS — BP 130/80 | HR 85 | Ht 66.5 in | Wt 244.4 lb

## 2016-02-18 DIAGNOSIS — R600 Localized edema: Secondary | ICD-10-CM | POA: Diagnosis not present

## 2016-02-18 DIAGNOSIS — I10 Essential (primary) hypertension: Secondary | ICD-10-CM

## 2016-02-18 NOTE — Patient Instructions (Addendum)

## 2018-10-27 ENCOUNTER — Encounter (HOSPITAL_BASED_OUTPATIENT_CLINIC_OR_DEPARTMENT_OTHER): Payer: Self-pay | Admitting: Emergency Medicine

## 2018-10-27 ENCOUNTER — Emergency Department (HOSPITAL_BASED_OUTPATIENT_CLINIC_OR_DEPARTMENT_OTHER): Payer: Managed Care, Other (non HMO)

## 2018-10-27 ENCOUNTER — Emergency Department (HOSPITAL_BASED_OUTPATIENT_CLINIC_OR_DEPARTMENT_OTHER)
Admission: EM | Admit: 2018-10-27 | Discharge: 2018-10-27 | Disposition: A | Discharge status: Home / Self Care | Payer: Managed Care, Other (non HMO) | Attending: Emergency Medicine | Admitting: Emergency Medicine

## 2018-10-27 ENCOUNTER — Other Ambulatory Visit: Payer: Self-pay

## 2018-10-27 DIAGNOSIS — Y92810 Car as the place of occurrence of the external cause: Secondary | ICD-10-CM | POA: Diagnosis not present

## 2018-10-27 DIAGNOSIS — I10 Essential (primary) hypertension: Secondary | ICD-10-CM | POA: Diagnosis not present

## 2018-10-27 DIAGNOSIS — S93401A Sprain of unspecified ligament of right ankle, initial encounter: Secondary | ICD-10-CM

## 2018-10-27 DIAGNOSIS — S99912A Unspecified injury of left ankle, initial encounter: Secondary | ICD-10-CM | POA: Diagnosis present

## 2018-10-27 DIAGNOSIS — Y9389 Activity, other specified: Secondary | ICD-10-CM | POA: Insufficient documentation

## 2018-10-27 DIAGNOSIS — Y999 Unspecified external cause status: Secondary | ICD-10-CM | POA: Insufficient documentation

## 2018-10-27 DIAGNOSIS — E119 Type 2 diabetes mellitus without complications: Secondary | ICD-10-CM | POA: Insufficient documentation

## 2018-10-27 DIAGNOSIS — W19XXXA Unspecified fall, initial encounter: Secondary | ICD-10-CM

## 2018-10-27 DIAGNOSIS — S82832A Other fracture of upper and lower end of left fibula, initial encounter for closed fracture: Secondary | ICD-10-CM | POA: Diagnosis not present

## 2018-10-27 DIAGNOSIS — E785 Hyperlipidemia, unspecified: Secondary | ICD-10-CM | POA: Insufficient documentation

## 2018-10-27 DIAGNOSIS — W010XXA Fall on same level from slipping, tripping and stumbling without subsequent striking against object, initial encounter: Secondary | ICD-10-CM | POA: Insufficient documentation

## 2018-10-27 DIAGNOSIS — S82839A Other fracture of upper and lower end of unspecified fibula, initial encounter for closed fracture: Secondary | ICD-10-CM

## 2018-10-27 DIAGNOSIS — M25561 Pain in right knee: Secondary | ICD-10-CM | POA: Diagnosis not present

## 2018-10-27 NOTE — Discharge Instructions (Signed)
You were seen in the emergency department today after a fall.  You have a subtle area over the left ankle that could be a small fracture.  You will need to follow with the orthopedic surgeon in the next 1 to 2 weeks.  I have provided a CAM walker boot to use for support.  Please try to minimize putting weight on this ankle until evaluated by the orthopedic surgeon.  Return to the emergency department any new or worsening symptoms.  Keep the wound over the left foot/ankle covered, clean, and dry.

## 2018-10-27 NOTE — ED Provider Notes (Signed)
Emergency Department Provider Note   I have reviewed the triage vital signs and the nursing notes.   HISTORY  Chief Complaint Ankle Pain   HPI Kristy Rios is a 68 y.o. female with PMH of DM, HLD, HTN, and OSA presents to the emergency department for evaluation of pain in the bilateral ankles after twisting them when getting out of the car yesterday.  Patient has had surgery on the left ankle after fracture several years ago.  She states while getting out of the car her left ankle inverted and caused her to lose balance.  She describes a "controlled fall" to the ground without head injury.  During the fall she did feel some twisting of the right ankle as well.  She was evaluated by a physician on scene and has been able to walk on the lower extremities without significant pain.  She has noticed swelling in both ankles/legs and has a "pulling" feeling near the right knee since yesterday.  She has noticed more swelling on the left ankle/foot with some bruising.  There is a small area of skin breakdown which the patient has been keeping clean and dry since yesterday.   Past Medical History:  Diagnosis Date  . Diabetes mellitus without complication ([place])   . High cholesterol   . Hypertension   . Irritable bowel syndrome   . Sleep apnea     Patient Active Problem List   Diagnosis Date Noted  . HTN (hypertension) 06/09/2014  . Type 2 diabetes mellitus not at goal [facility]) 06/09/2014  . 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 ANKLE FRACTURE SURGERY    . CHOLECYSTECTOMY    . TONSILLECTOMY    . TUBAL LIGATION      Allergies Patient has no known allergies.  Family History  Problem Relation Age of Onset  . Diabetes Mother   . Hypertension Mother   . Sleep apnea Mother   . Heart disease Father   . Hyperlipidemia Father   . Kidney cancer Maternal Grandmother   . Cancer Maternal Grandfather   . Healthy Brother   .  Healthy Brother     Social History Social History   Tobacco Use  . Smoking status: Never Smoker  . Smokeless tobacco: Never Used  Substance Use Topics  . Alcohol use: Yes    Alcohol/week: 0.0 standard drinks  . Drug use: Not on file    Review of Systems  Constitutional: No fever/chills Eyes: No visual changes. ENT: No sore throat. Cardiovascular: Denies chest pain. Respiratory: Denies shortness of breath. Gastrointestinal: No abdominal pain. No nausea, no vomiting. No diarrhea. No constipation. Genitourinary: Negative for dysuria. Musculoskeletal: Negative for back pain. Positive bilateral ankle pain.  Skin: Negative for rash. Neurological: Negative for headaches, focal weakness or numbness.  10-point ROS otherwise negative.  ____________________________________________   PHYSICAL EXAM:  VITAL SIGNS: ED Triage Vitals  Enc Vitals Group     BP 10/27/18 1039 (!) 148/94     Pulse Rate 10/27/18 1039 87     Resp 10/27/18 1039 20     Temp 10/27/18 1039 98.6 F (37 C)     Temp Source 10/27/18 1039 Oral     SpO2 10/27/18 1039 98 %     Weight 10/27/18 1037 250 lb (113.4 kg)     Height 10/27/18 1037 5\' 6"  (1.676 m)   Constitutional: Alert and oriented. Well appearing and in no acute distress. Eyes: Conjunctivae are  normal.  Head: Atraumatic. Nose: No congestion/rhinnorhea. Mouth/Throat: Mucous membranes are moist.  Neck: No stridor.  Cardiovascular: Normal rate, regular rhythm.  Respiratory: Normal respiratory effort.  Gastrointestinal: No distention.  Musculoskeletal: Patient with bilateral ankle swelling worse on the left with swelling extending into the foot.  There is some ecchymosis in the lateral left foot surrounding an area of superficial, abrasion type injury.  No laceration.  No proximal fibular tenderness on the left but does have some mild discomfort on the right.  Some mild discomfort noted to palpation of the left midfoot.  Neurologic:  Normal speech and  language. Normal sensation in the bilateral LEs.  Skin:  Skin is warm, dry and intact. No rash noted.  ____________________________________________  RADIOLOGY  Dg Knee 2 Views Right  Result Date: 10/27/2018 CLINICAL DATA:  Pain after trauma EXAM: RIGHT KNEE - 1-2 VIEW COMPARISON:  None. FINDINGS: Mild enthesopathic changes off of the superior patella. Mild tricompartmental degenerative changes noted. No fracture or effusion. IMPRESSION: Mild degenerative changes.  No fracture or effusion. Electronically Signed   By: Dorise Bullion III M.D   On: 10/27/2018 11:38   Dg Ankle Complete Left  Result Date: 10/27/2018 CLINICAL DATA:  Pain after trauma EXAM: LEFT ANKLE COMPLETE - 3+ VIEW COMPARISON:  None. FINDINGS: Two screws project of the distal tibia, in good position. A plate is affixed to the distal fibula, in good position. Significant soft tissue swelling is noted. The ankle mortise is intact. There is a subtle lucency through the distal most tip of the fibula. No other abnormalities. IMPRESSION: 1. Significant soft tissue swelling, particularly laterally. 2. A subtle lucency through the distal tip of the fibula, distal to the plate affixed to the distal fibula, is worrisome for a subtle nondisplaced fracture. 3. No other evidence of fracture. Surgical hardware is in good position. Electronically Signed   By: Dorise Bullion III M.D   On: 10/27/2018 11:45   Dg Ankle Complete Right  Result Date: 10/27/2018 CLINICAL DATA:  Pain after trauma EXAM: RIGHT ANKLE - COMPLETE 3+ VIEW COMPARISON:  History of fracture in 2006.  Soreness on the right. FINDINGS: Deformity of the distal tip of the fibula is chronic in appearance consistent with previous injury. No acute fractures are noted. The ankle mortise is intact. Mild soft tissue swelling. IMPRESSION: Mild soft tissue swelling.  No acute fracture. Electronically Signed   By: Dorise Bullion III M.D   On: 10/27/2018 11:39   Dg Foot Complete Left  Result  Date: 10/27/2018 CLINICAL DATA:  Pain after trauma EXAM: LEFT FOOT - COMPLETE 3+ VIEW COMPARISON:  None. FINDINGS: Surgical hardware seen in the ankle to the distal tibia and fibula. Hardware is in good position. Soft tissue swelling is noted. No acute fractures identified. IMPRESSION: Soft tissue swelling.  No fracture identified. Electronically Signed   By: Dorise Bullion III M.D   On: 10/27/2018 11:43    ____________________________________________   PROCEDURES  Procedure(s) performed:   Procedures  None  ____________________________________________   INITIAL IMPRESSION / ASSESSMENT AND PLAN / ED COURSE  Pertinent labs & imaging results that were available during my care of the patient were reviewed by me and considered in my medical decision making (see chart for details).   Patient presents to the emergency department for evaluation after mechanical fall.  She has bruising and swelling mostly in the left ankle but some mild tenderness in the right ankle as well as right knee.  Plan for plain films and reassess  after imaging.  Low suspicion for fracture clinically.  Plain films and reads reviewed.  There is a subtle lucency at the tip of the distal fibula.  The fibula is nondisplaced.  Patient has been ambulatory on the foot without pain.  Plan to place her in a CAM walking boot and will have her follow-up with orthopedic surgery in the coming 1 to 2 weeks.  No other injuries identified.  Discussed wound care in detail with the patient and son at bedside.  Wound is very well-appearing at this time.  Will provide dressing to the area prior to CAM boot application.  ____________________________________________  FINAL CLINICAL IMPRESSION(S) / ED DIAGNOSES  Final diagnoses:  Fall, initial encounter  Sprain of right ankle, unspecified ligament, initial encounter  Avulsion fracture of distal fibula    Note:  This document was prepared using Dragon voice recognition software and may  include unintentional dictation errors.  Nanda Quinton, MD Emergency Medicine    Long, [facility], MD 10/27/18 [phone]

## 2018-10-27 NOTE — ED Triage Notes (Signed)
States she twisted both ankles last night when getting out of the car.

## 2018-10-27 NOTE — ED Notes (Signed)
XR at bedside

## 2019-04-12 ENCOUNTER — Ambulatory Visit: Payer: Managed Care, Other (non HMO) | Attending: Internal Medicine

## 2019-04-12 DIAGNOSIS — Z23 Encounter for immunization: Secondary | ICD-10-CM | POA: Insufficient documentation

## 2019-04-12 NOTE — Progress Notes (Signed)
   Covid-19 Vaccination Clinic  Name:  KALANA YUST    MRN: [id number] DOB: [birth year]  04/12/2019  Ms. Zehring was observed post Covid-19 immunization for 15 minutes without incidence. She was provided with Vaccine Information Sheet and instruction to access the V-Safe system.   Ms. Biddy was instructed to call 911 with any severe reactions post vaccine: Marland Kitchen Difficulty breathing  . Swelling of your face and throat  . A fast heartbeat  . A bad rash all over your body  . Dizziness and weakness    Immunizations Administered    Name Date Dose VIS Date Route   Pfizer COVID-19 Vaccine 04/12/2019  2:51 PM 0.3 mL 03/01/2019 Intramuscular   Manufacturer: [place]   Lot: [id number]   [place]: [id number]

## 2019-05-06 ENCOUNTER — Ambulatory Visit: Payer: Managed Care, Other (non HMO) | Attending: Internal Medicine

## 2019-05-06 DIAGNOSIS — Z23 Encounter for immunization: Secondary | ICD-10-CM | POA: Insufficient documentation

## 2019-05-06 NOTE — Progress Notes (Signed)
   Covid-19 Vaccination Clinic  Name:  TORIANNA JUNIO    MRN: [id number] DOB: 1950-12-02  05/06/2019  Ms. Bugge was observed post Covid-19 immunization for 15 minutes without incidence. She was provided with Vaccine Information Sheet and instruction to access the V-Safe system.   Ms. Boutwell was instructed to call 911 with any severe reactions post vaccine: Marland Kitchen Difficulty breathing  . Swelling of your face and throat  . A fast heartbeat  . A bad rash all over your body  . Dizziness and weakness    Immunizations Administered    Name Date Dose VIS Date Route   Pfizer COVID-19 Vaccine 05/06/2019 11:16 AM 0.3 mL 03/01/2019 Intramuscular   Manufacturer: [place]   Lot: [id number]   [place]: [id number]

## 2019-10-17 ENCOUNTER — Other Ambulatory Visit: Payer: Self-pay

## 2019-10-17 ENCOUNTER — Encounter (HOSPITAL_BASED_OUTPATIENT_CLINIC_OR_DEPARTMENT_OTHER): Payer: Self-pay | Admitting: Emergency Medicine

## 2019-10-17 ENCOUNTER — Emergency Department (HOSPITAL_BASED_OUTPATIENT_CLINIC_OR_DEPARTMENT_OTHER)
Admission: EM | Admit: 2019-10-17 | Discharge: 2019-10-17 | Disposition: A | Discharge status: Home / Self Care | Payer: Managed Care, Other (non HMO) | Attending: Emergency Medicine | Admitting: Emergency Medicine

## 2019-10-17 DIAGNOSIS — E119 Type 2 diabetes mellitus without complications: Secondary | ICD-10-CM | POA: Insufficient documentation

## 2019-10-17 DIAGNOSIS — Y9389 Activity, other specified: Secondary | ICD-10-CM | POA: Insufficient documentation

## 2019-10-17 DIAGNOSIS — Z79899 Other long term (current) drug therapy: Secondary | ICD-10-CM | POA: Insufficient documentation

## 2019-10-17 DIAGNOSIS — S81812A Laceration without foreign body, left lower leg, initial encounter: Secondary | ICD-10-CM | POA: Insufficient documentation

## 2019-10-17 DIAGNOSIS — I10 Essential (primary) hypertension: Secondary | ICD-10-CM | POA: Diagnosis not present

## 2019-10-17 DIAGNOSIS — Y999 Unspecified external cause status: Secondary | ICD-10-CM | POA: Diagnosis not present

## 2019-10-17 DIAGNOSIS — S8992XA Unspecified injury of left lower leg, initial encounter: Secondary | ICD-10-CM | POA: Diagnosis present

## 2019-10-17 DIAGNOSIS — Y92810 Car as the place of occurrence of the external cause: Secondary | ICD-10-CM | POA: Insufficient documentation

## 2019-10-17 DIAGNOSIS — W231XXA Caught, crushed, jammed, or pinched between stationary objects, initial encounter: Secondary | ICD-10-CM | POA: Diagnosis not present

## 2019-10-17 HISTORY — DX: Other malignant neuroendocrine tumors: C7A.8

## 2019-10-17 MED ORDER — TETANUS-DIPHTH-ACELL PERTUSSIS 5-2.5-18.5 LF-MCG/0.5 IM SUSP
0.5000 mL | Freq: Once | INTRAMUSCULAR | Status: AC
  Administered 2019-10-17: 0.5 mL via INTRAMUSCULAR
  Filled 2019-10-17: qty 0.5

## 2019-10-17 MED ORDER — LIDOCAINE-EPINEPHRINE (PF) 1 %-1:200000 IJ SOLN
INTRAMUSCULAR | Status: AC
  Administered 2019-10-17: 30 mL
  Filled 2019-10-17: qty 30

## 2019-10-17 NOTE — ED Triage Notes (Signed)
Pt arrives with skin tear to left anterior lower leg, states she got caught on a car door. Needs tdap updated.

## 2019-10-17 NOTE — Discharge Instructions (Addendum)
You will need to have your sutures and staples taken out in about 10 days.  You can come to this ED, and the ED, urgent care your primary care provider's office. Apply antibiotic ointment to the area beginning in about 2 days. Return sooner for signs of infection including redness, streaking, fever or swelling.

## 2019-10-17 NOTE — ED Provider Notes (Signed)
[facility] EMERG[facility] DE[facility]MENT Provider Note   CSN: [id number] Arrival date & time: 10/17/19  2030     History Chief Complaint  Patient presents with  . Leg Injury    Kristy Rios is a 69 y.o. female with a past medical history of diabetes, hypertension presenting to the ED after sustaining a laceration to the left lower extremity.  States that she was trying to close the car door when it struck her on her left lower extremity.  She is unsure of her last tetanus.  She takes a baby aspirin daily.  Denies any other complaints or other injuries.  HPI     Past Medical History:  Diagnosis Date  . Diabetes mellitus without complication ([place])   . High cholesterol   . Hypertension   . Irritable bowel syndrome   . 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 Sleep apnea     Patient Active Problem List   Diagnosis Date Noted  . HTN (hypertension) 06/09/2014  . Type 2 diabetes mellitus not at goal [facility]) 06/09/2014  . 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 ANKLE FRACTURE SURGERY    . CHOLECYSTECTOMY    . TONSILLECTOMY    . TUBAL LIGATION       OB History   No obstetric history on file.     Family History  Problem Relation Age of Onset  . Diabetes Mother   . Hypertension Mother   . Sleep apnea Mother   . Heart disease Father   . Hyperlipidemia Father   . Kidney cancer Maternal Grandmother   . Cancer Maternal Grandfather   . Healthy Brother   . Healthy Brother     Social History   Tobacco Use  . Smoking status: Never Smoker  . Smokeless tobacco: Never Used  Substance Use Topics  . Alcohol use: Yes    Alcohol/week: 0.0 standard drinks  . Drug use: Not on file    Home Medications Prior to Admission medications   Medication Sig Start Date End Date Taking? Authorizing Provider  ALPRAZolam Duanne Moron) 0.5 MG tablet Take 0.5 mg by mouth at bedtime as needed for anxiety.    [provider]    bumetanide (BUMEX) 1 MG tablet Take 1 mg by mouth daily.    [provider]  clotrimazole-betamethasone (LOTRISONE) cream Apply 1 application topically 3 (three) times daily.    [provider]  cyclobenzaprine (FLEXERIL) 5 MG tablet 1-2 po up to bid prn muscle relaxer 06/26/18   [provider]  doxazosin (CARDURA) 4 MG tablet TAKE ONE TABLET BY MOUTH ONE TIME DAILY 08/23/17   [provider]  fexofenadine (ALLEGRA) 60 MG tablet Take 60 mg by mouth daily.    [provider]  glucose blood test strip TEST BLOOD SUGAR BEFORE EACH MEAL AND 2 HOURS AFTER ONE MEAL DURING THE DAY AND EVERY NIGHT AT BEDTIME    [provider]  hyoscyamine (LEVSIN, ANASPAZ) 0.125 MG tablet Take 0.125 mg by mouth every 4 (four) hours as needed (nausea).    [provider]  Insulin Degludec (TRESIBA FLEXTOUCH) 200 UNIT/ML SOPN INJECT 80 UNITS SUBCUTANEOUSLY DAILY 09/18/17   [provider]  Liraglutide 18 MG/3ML SOPN Inject 1.8 mg into the skin daily.    [provider]  lisinopril (PRINIVIL,ZESTRIL) 40 MG tablet Take 40 mg by mouth daily. 03/28/14   [provider]  mometasone (NASONEX) 50 MCG/ACT nasal spray Place 2  sprays into the nose daily.    [provider]  simvastatin (ZOCOR) 40 MG tablet Take 40 mg by mouth daily. 05/15/14   [provider]  verapamil (VERELAN PM) 240 MG 24 hr capsule Take 360 mg by mouth daily.  03/30/14   [provider]  zolpidem (AMBIEN) 5 MG tablet Take 5 mg by mouth at bedtime as needed for sleep.    [provider]    Allergies    Sulfamethoxazole-trimethoprim  Review of Systems   Review of Systems  Constitutional: Negative for chills and fever.  Skin: Positive for wound.  Neurological: Negative for weakness and numbness.    Physical Exam Updated Vital Signs BP (!) 152/68   Pulse 88   Temp 98.5 F (36.9 C) (Oral)   Resp 18   Wt (!) 117 kg   SpO2 98%   BMI  41.63 kg/m   Physical Exam Vitals and nursing note reviewed.  Constitutional:      General: She is not in acute distress.    Appearance: She is well-developed. She is not diaphoretic.  HENT:     Head: Normocephalic and atraumatic.  Eyes:     General: No scleral icterus.    Conjunctiva/sclera: Conjunctivae normal.  Pulmonary:     Effort: Pulmonary effort is normal. No respiratory distress.  Musculoskeletal:     Cervical back: Normal range of motion.  Skin:    Findings: Abrasion and laceration present. No rash.     Comments: 4 cm x 4 cm x 3 cm skin tear noted of the left lower extremity.  Slight oozing blood noted.  Neurological:     Mental Status: She is alert.       ED Results / Procedures / Treatments   Labs (all labs ordered are listed, but only abnormal results are displayed) Labs Reviewed - No data to display  EKG None  Radiology No results found.  Procedures .Marland KitchenLaceration Repair  Date/Time: 10/17/2019 10:22 PM Performed by: Delia Heady, PA-C Authorized by: Delia Heady, PA-C   Consent:    Consent obtained:  Verbal   Consent given by:  Patient   Risks discussed:  Infection, need for additional repair, nerve damage, pain, poor cosmetic result, poor wound healing, retained foreign body, vascular damage and tendon damage   Alternatives discussed:  No treatment Anesthesia (see MAR for exact dosages):    Anesthesia method:  Local infiltration   Local anesthetic:  Lidocaine 1% WITH epi Laceration details:    Location:  Leg   Leg location:  L lower leg   Length (cm):  4 Pre-procedure details:    Preparation:  Patient was prepped and draped in usual sterile fashion Exploration:    Hemostasis achieved with:  Direct pressure Treatment:    Area cleansed with:  Saline   Amount of cleaning:  Extensive   Irrigation solution:  Sterile saline   Irrigation method:  Syringe Skin repair:    Repair method:  Staples and sutures   Suture size:  4-0   Suture material:   Nylon   Suture technique:  Simple interrupted   Number of sutures:  5   Number of staples:  8 Approximation:    Approximation:  Close Post-procedure details:    Dressing:  Open (no dressing)   Patient tolerance of procedure:  Tolerated well, no immediate complications   (including critical care time)  Medications Ordered in ED Medications  Tdap (BOOSTRIX) injection 0.5 mL (0.5 mLs Intramuscular Given 10/17/19 2129)  lidocaine-EPINEPHrine (XYLOCAINE-EPINEPHrine)  1 %-1:200000 (PF) injection (30 mLs  Given 10/17/19 2132)    ED Course  I have reviewed the triage vital signs and the nursing notes.  Pertinent labs & imaging results that were available during my care of the patient were reviewed by me and considered in my medical decision making (see chart for details).    MDM Rules/Calculators/A&P                          69 year old female on a baby aspirin daily presenting to the ED with skin tear/laceration to the left lower leg after being scraped by the car door.  Pressure irrigation performed. Wound explored and base of wound visualized in a bloodless field without evidence of foreign body.  Laceration occurred < 8 hours prior to repair which was well tolerated.Tdap updated.  Pt has  no comorbidities to effect normal wound healing. Pt discharged  with topical antibiotics. Patient counseled on wound care. Patient counseled on need to return or see PCP/urgent care for suture removal in 10 days. Patient was urged to return to the Emergency Department urgently with worsening pain, swelling, expanding erythema especially if it streaks away from the affected area, fever, or if they have any other concerns. Patient verbalized understanding.  Patient discussed with and seen by the attending, Dr. Ronnald Nian.  Patient is hemodynamically stable, in NAD, and able to ambulate in the ED. Evaluation does not show pathology that would require ongoing emergent intervention or inpatient treatment. I  explained the diagnosis to the patient. Pain has been managed and has no complaints prior to discharge. Patient is comfortable with above plan and is stable for discharge at this time. All questions were answered prior to disposition. Strict return precautions for returning to the ED were discussed. Encouraged follow up with PCP.   An After Visit Summary was printed and given to the patient.   Portions of this note were generated with [employer]. Dictation errors may occur despite best attempts at proofreading.  Final Clinical Impression(s) / ED Diagnoses Final diagnoses:  Laceration of left lower extremity, initial encounter    Rx / DC Orders ED Discharge Orders    None       Delia Heady, PA-C 10/17/19 2225    Lennice Sites, DO 10/17/19 2310

## 2020-03-17 ENCOUNTER — Other Ambulatory Visit: Payer: Self-pay

## 2020-03-17 ENCOUNTER — Emergency Department (HOSPITAL_BASED_OUTPATIENT_CLINIC_OR_DEPARTMENT_OTHER): Payer: Managed Care, Other (non HMO)

## 2020-03-17 ENCOUNTER — Encounter (HOSPITAL_BASED_OUTPATIENT_CLINIC_OR_DEPARTMENT_OTHER): Payer: Self-pay

## 2020-03-17 ENCOUNTER — Emergency Department (HOSPITAL_BASED_OUTPATIENT_CLINIC_OR_DEPARTMENT_OTHER)
Admission: EM | Admit: 2020-03-17 | Discharge: 2020-03-17 | Disposition: A | Discharge status: Home / Self Care | Payer: Managed Care, Other (non HMO) | Attending: Emergency Medicine | Admitting: Emergency Medicine

## 2020-03-17 DIAGNOSIS — M25552 Pain in left hip: Secondary | ICD-10-CM | POA: Insufficient documentation

## 2020-03-17 DIAGNOSIS — E119 Type 2 diabetes mellitus without complications: Secondary | ICD-10-CM | POA: Diagnosis not present

## 2020-03-17 DIAGNOSIS — Z794 Long term (current) use of insulin: Secondary | ICD-10-CM | POA: Insufficient documentation

## 2020-03-17 DIAGNOSIS — I10 Essential (primary) hypertension: Secondary | ICD-10-CM | POA: Insufficient documentation

## 2020-03-17 DIAGNOSIS — Z79899 Other long term (current) drug therapy: Secondary | ICD-10-CM | POA: Insufficient documentation

## 2020-03-17 MED ORDER — LIDOCAINE 5 % EX PTCH: 1.0000 | MEDICATED_PATCH | Freq: Every day | CUTANEOUS | 0 refills | Status: DC | PRN

## 2020-03-17 MED ORDER — LIDOCAINE 5 % EX PTCH: 1.0000 | MEDICATED_PATCH | Freq: Every day | CUTANEOUS | 0 refills | Status: AC | PRN

## 2020-03-17 NOTE — ED Triage Notes (Signed)
Pt reports L hip pain x7 days without injury. Pt states she has been treating the pain for arthritis but is concerned it is something more.

## 2020-03-17 NOTE — ED Provider Notes (Signed)
[facility] EMERGENCY DEPARTMENT Provider Note   CSN: [id number] Arrival date & time: 03/17/20  1004     History Chief Complaint  Patient presents with  . Hip Pain    Kristy Rios is a 69 y.o. female with a history of diabetes mellitus, hypertension, hypercholesterolemia, and IBS who presents to the emergency department with complaints of left hip pain for the past 1 week.  Patient states that she developed left posterior lateral hip pain approximately 1 week ago, states pain is really with weightbearing/walking, otherwise she is not having significant discomfort.  Taking ibuprofen/Tylenol with some relief.  States it feels somewhat like her prior arthritis discomfort, however lasted longer than usual which concerned her.  She denies numbness, tingling, weakness, fever, chills, rash, redness to the area, incontinence, saddle anesthesia, or recent trauma. No significant change in activity however she has been getting out of vehicles more frequently utilizing that leg.   HPI     Past Medical History:  Diagnosis Date  . Diabetes mellitus without complication ([place])   . High cholesterol   . Hypertension   . Irritable bowel syndrome   . 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 Sleep apnea     Patient Active Problem List   Diagnosis Date Noted  . HTN (hypertension) 06/09/2014  . Type 2 diabetes mellitus not at goal [facility]) 06/09/2014  . 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 ANKLE FRACTURE SURGERY    . CHOLECYSTECTOMY    . TONSILLECTOMY    . TUBAL LIGATION       OB History   No obstetric history on file.     Family History  Problem Relation Age of Onset  . Diabetes Mother   . Hypertension Mother   . Sleep apnea Mother   . Heart disease Father   . Hyperlipidemia Father   . Kidney cancer Maternal Grandmother   . Cancer Maternal Grandfather   . Healthy Brother   . Healthy Brother     Social History    Tobacco Use  . Smoking status: Never Smoker  . Smokeless tobacco: Never Used  Substance Use Topics  . Alcohol use: Yes    Alcohol/week: 0.0 standard drinks  . Drug use: Never    Home Medications Prior to Admission medications   Medication Sig Start Date End Date Taking? Authorizing Provider  ALPRAZolam Duanne Moron) 0.5 MG tablet Take 0.5 mg by mouth at bedtime as needed for anxiety.    [provider]  bumetanide (BUMEX) 1 MG tablet Take 1 mg by mouth daily.    [provider]  clotrimazole-betamethasone (LOTRISONE) cream Apply 1 application topically 3 (three) times daily.    [provider]  cyclobenzaprine (FLEXERIL) 5 MG tablet 1-2 po up to bid prn muscle relaxer 06/26/18   [provider]  doxazosin (CARDURA) 4 MG tablet TAKE ONE TABLET BY MOUTH ONE TIME DAILY 08/23/17   [provider]  fexofenadine (ALLEGRA) 60 MG tablet Take 60 mg by mouth daily.    [provider]  glucose blood test strip TEST BLOOD SUGAR BEFORE EACH MEAL AND 2 HOURS AFTER ONE MEAL DURING THE DAY AND EVERY NIGHT AT BEDTIME    [provider]  hyoscyamine (LEVSIN, ANASPAZ) 0.125 MG tablet Take 0.125 mg by mouth every 4 (four) hours as needed (nausea).    [provider]  Insulin Degludec (TRESIBA FLEXTOUCH) 200 UNIT/ML SOPN INJECT 80 UNITS SUBCUTANEOUSLY DAILY 09/18/17  [provider]  Liraglutide 18 MG/3ML SOPN Inject 1.8 mg into the skin daily.    [provider]  lisinopril (PRINIVIL,ZESTRIL) 40 MG tablet Take 40 mg by mouth daily. 03/28/14   [provider]  mometasone (NASONEX) 50 MCG/ACT nasal spray Place 2 sprays into the nose daily.    [provider]  simvastatin (ZOCOR) 40 MG tablet Take 40 mg by mouth daily. 05/15/14   [provider]  verapamil (VERELAN PM) 240 MG 24 hr capsule Take 360 mg by mouth daily.  03/30/14   [provider]  zolpidem (AMBIEN) 5 MG tablet Take 5 mg by mouth at  bedtime as needed for sleep.    [provider]    Allergies    Sulfamethoxazole-trimethoprim  Review of Systems   Review of Systems  Constitutional: Negative for chills and fever.  Respiratory: Negative for shortness of breath.   Cardiovascular: Negative for chest pain.  Gastrointestinal: Negative for vomiting.  Genitourinary: Negative for dysuria, frequency and urgency.  Musculoskeletal: Positive for arthralgias.  Skin: Negative for color change, rash and wound.  Neurological: Negative for weakness and numbness.       Negative for incontinence or saddle anesthesia.  All other systems reviewed and are negative.   Physical Exam Updated Vital Signs BP (!) 170/76 (BP Location: Left Arm)   Pulse 73   Temp 98.1 F (36.7 C) (Oral)   Resp 18   Ht 5\' 6"  (1.676 m)   Wt 113.4 kg   SpO2 94%   BMI 40.35 kg/m   Physical Exam Vitals and nursing note reviewed.  Constitutional:      General: She is not in acute distress.    Appearance: She is well-developed and well-nourished. She is not ill-appearing or toxic-appearing.  HENT:     Head: Normocephalic and atraumatic.  Cardiovascular:     Pulses: Intact distal pulses.          Dorsalis pedis pulses are 2+ on the right side and 2+ on the left side.       Posterior tibial pulses are 2+ on the right side and 2+ on the left side.  Pulmonary:     Effort: Pulmonary effort is normal.  Abdominal:     General: There is no distension.     Palpations: Abdomen is soft.     Tenderness: There is no abdominal tenderness. There is no guarding or rebound.  Musculoskeletal:     Cervical back: Normal range of motion and neck supple. No spinous process tenderness or muscular tenderness.     Comments: Lower extremities: No obvious deformity, appreciable swelling, asymmetric edema, erythema, ecchymosis, warmth, or open wounds. No rashes. Patient has intact AROM to bilateral hips, knees, ankles, and all digits. Tender to palpation to the left  posterior hip/gluteal area as well as to the SI joint.  Otherwise nontender. Back: No midline tenderness to palpation.  Left lumbar lower paraspinal muscle tenderness to palpation.   Skin:    General: Skin is warm and dry.     Capillary Refill: Capillary refill takes less than 2 seconds.     Findings: No rash.  Neurological:     Mental Status: She is alert.     Deep Tendon Reflexes:     Reflex Scores:      Patellar reflexes are 2+ on the right side and 2+ on the left side.    Comments: Alert. Clear speech. Sensation grossly intact to bilateral lower extremities. 5/5 strength with plantar/dorsiflexion bilaterally.  Patient ambulatory.   Psychiatric:        Mood and Affect: Mood normal.        Behavior: Behavior normal.     ED Results / Procedures / Treatments   Labs (all labs ordered are listed, but only abnormal results are displayed) Labs Reviewed - No data to display  EKG None  Radiology DG Hip Unilat With Pelvis 2-3 Views Left  Result Date: 03/17/2020 CLINICAL DATA:  69 year old female with 1 week of left hip pain. No known injury. EXAM: DG HIP (WITH OR WITHOUT PELVIS) 2-3V LEFT COMPARISON:  CT Abdomen and Pelvis 01/26/2014. FINDINGS: Chronic dystrophic calcification of the left adnexa appears stable since 2015. Other pelvic vascular phleboliths are noted. Negative visible bowel gas pattern. Intact pelvis. Femoral heads normally located. Hip joint spaces appear symmetric and stable since 2015. SI joints appear normal. Grossly intact proximal right femur. Proximal left femur appears intact and within normal limits. IMPRESSION: Negative, no osseous abnormality identified about the left hip or pelvis. Electronically Signed   By: Genevie Ann M.D.   On: 03/17/2020 11:17    Procedures Procedures (including critical care time)  Medications Ordered in ED Medications - No data to display  ED Course  I have reviewed the triage vital signs and the nursing notes.  Pertinent labs & imaging  results that were available during my care of the patient were reviewed by me and considered in my medical decision making (see chart for details).    MDM Rules/Calculators/A&P                          Patient presents to the emergency department with complaints of atraumatic left hip pain.  She is nontoxic, resting comfortably, vitals WNL with exception of elevated blood pressure, doubt hypertensive emergency.  X-ray was performed per triage protocol, I personally reviewed and interpreted imaging, no acute process.  No recent trauma, patient is ambulatory, do not feel that CT is necessary to look for occult fracture at this time based on history and exam.  There is no overlying erythema, warmth, rashes, or wounds, patient is afebrile, does not seem consistent with shingles, cellulitis, septic joint, or epidural abscess.  No neuro deficits, ambulatory does not seem like cord compression or cauda equina syndrome.  No asymmetric edema to raise concern for DVT, localized well to the hip.  NVI distally.  No urinary symptoms.  Abdomen nontender without peritoneal signs.  We will try Lidoderm patches with PCP/sports medicine follow-up. I discussed results, treatment plan, need for follow-up, and return precautions with the patient. Provided opportunity for questions, patient confirmed understanding and is in agreement with plan.   Findings and plan of care discussed with supervising physician Dr. Dina Rich who evaluated patient as shared visit & is in agreement.   Final Clinical Impression(s) / ED Diagnoses Final diagnoses:  Left hip pain    Rx / DC Orders ED Discharge Orders         Ordered    lidocaine (LIDODERM) 5 %  Daily PRN        03/17/20 1414           Maddyx Vallie, [place] R, PA-C 03/17/20 1417    Lorelle Gibbs, DO 03/17/20 2034

## 2020-03-17 NOTE — Discharge Instructions (Addendum)
You were seen in the emergency department today for left hip pain.  Your x-ray did not show any fractures or dislocations.  We suspect your pain may be muscular, please try applying a Lidoderm patch to area most significant pain once per day, remove back within 12 hours of application.  Please continue to take Tylenol per over-the-counter dosing.  Please try applying heat.  We have prescribed you new medication(s) today. Discuss the medications prescribed today with your pharmacist as they can have adverse effects and interactions with your other medicines including over the counter and prescribed medications. Seek medical evaluation if you start to experience new or abnormal symptoms after taking one of these medicines, seek care immediately if you start to experience difficulty breathing, feeling of your throat closing, facial swelling, or rash as these could be indications of a more serious allergic reaction  Please have your primary care provider or the sports medicine doctor your discharge instructions within 3 days for reevaluation.  Return to the ER for any new or worsening symptoms including but not limited to worsening pain, new pain, redness to the area, rashes to the area, leg swelling, numbness, weakness, loss of control of bowel or bladder function, or any other concerns.

## 2020-03-23 ENCOUNTER — Telehealth: Payer: Self-pay | Admitting: Family Medicine

## 2020-03-23 NOTE — Telephone Encounter (Signed)
Left pt message to call office if interested in ED follow up care for hip pain.  --glh

## 2021-02-17 IMAGING — DX RIGHT ANKLE - COMPLETE 3+ VIEW
3 series · 3 of 3 positions shown · non-contrast
Comparison: History of fracture in 4996.  Soreness on the right.

CLINICAL DATA: Pain after trauma

EXAM:
RIGHT ANKLE - COMPLETE 3+ VIEW

[ankle ap]
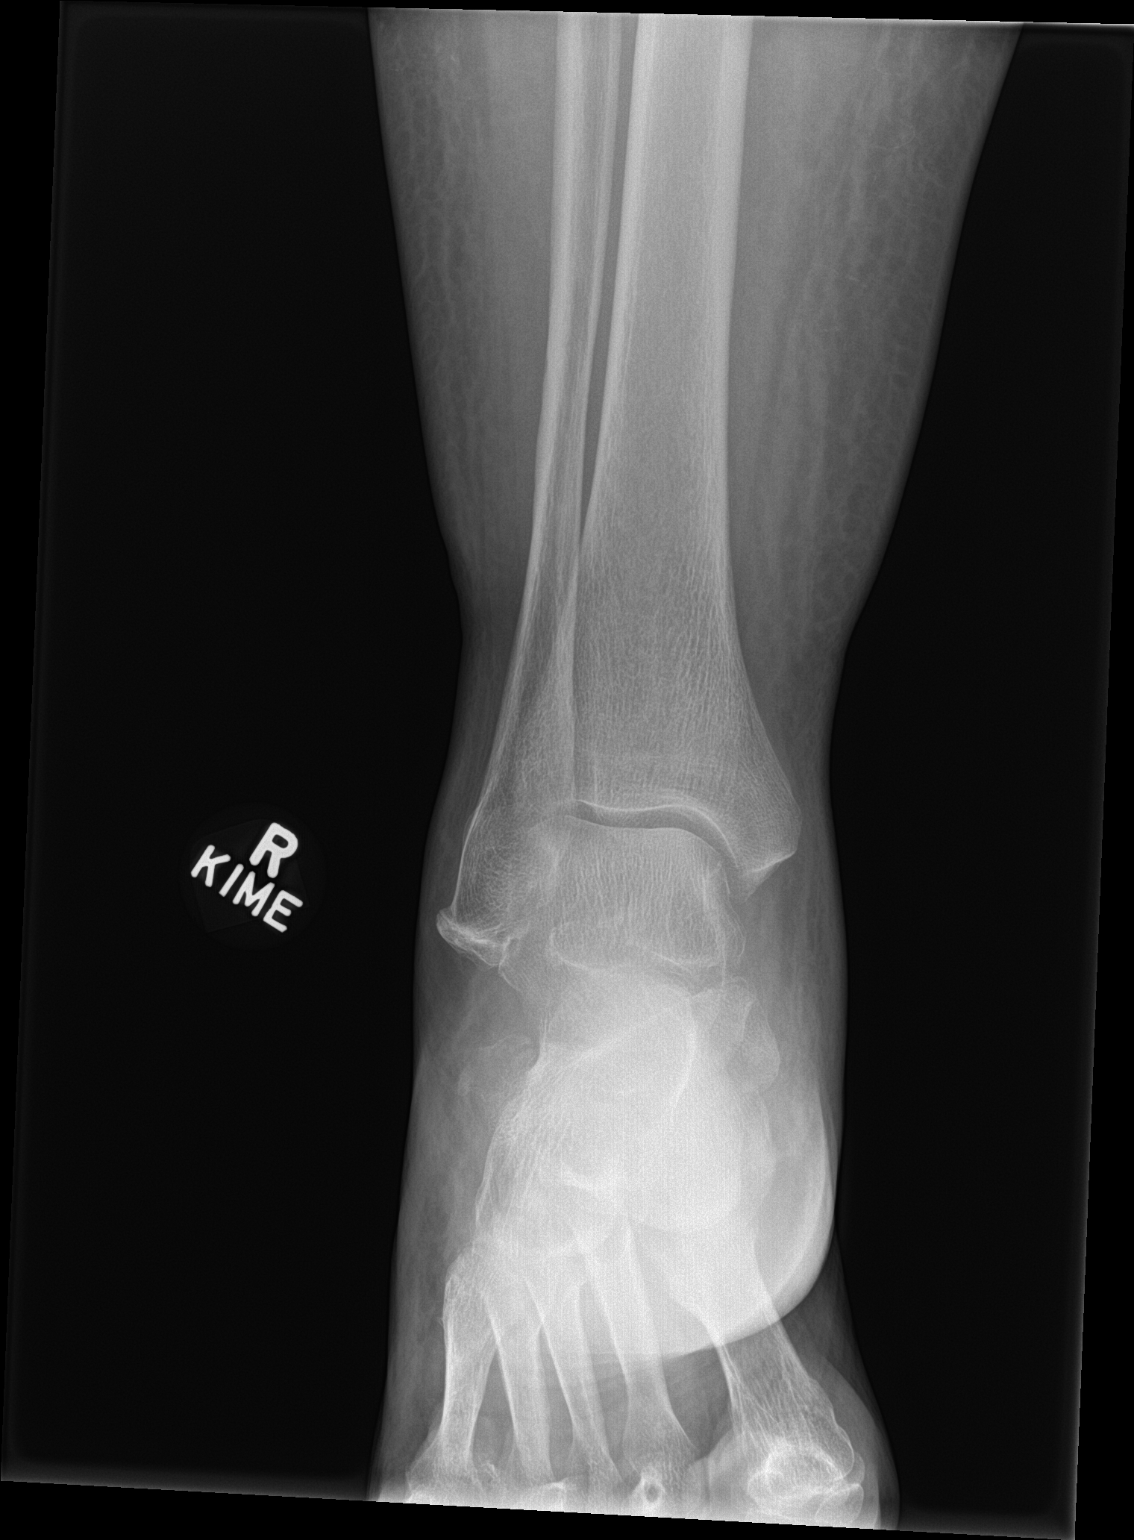

[ankle obl]
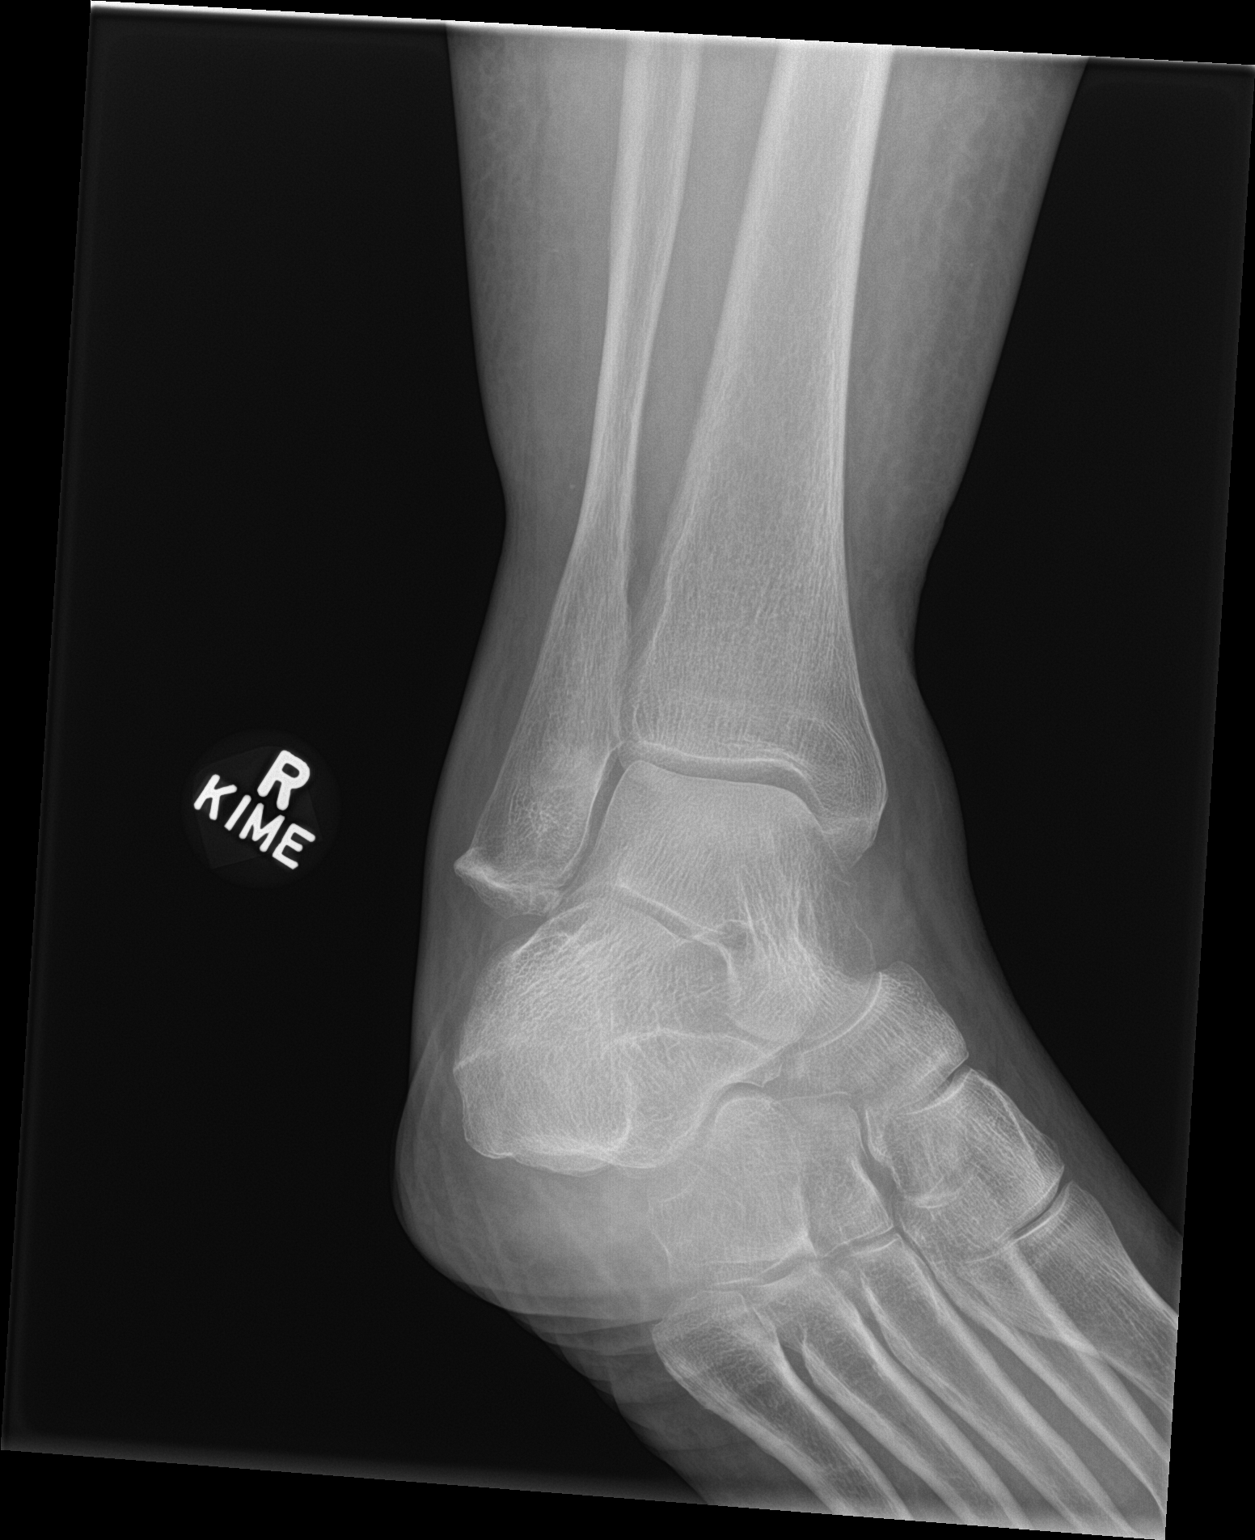

[ankle lat]
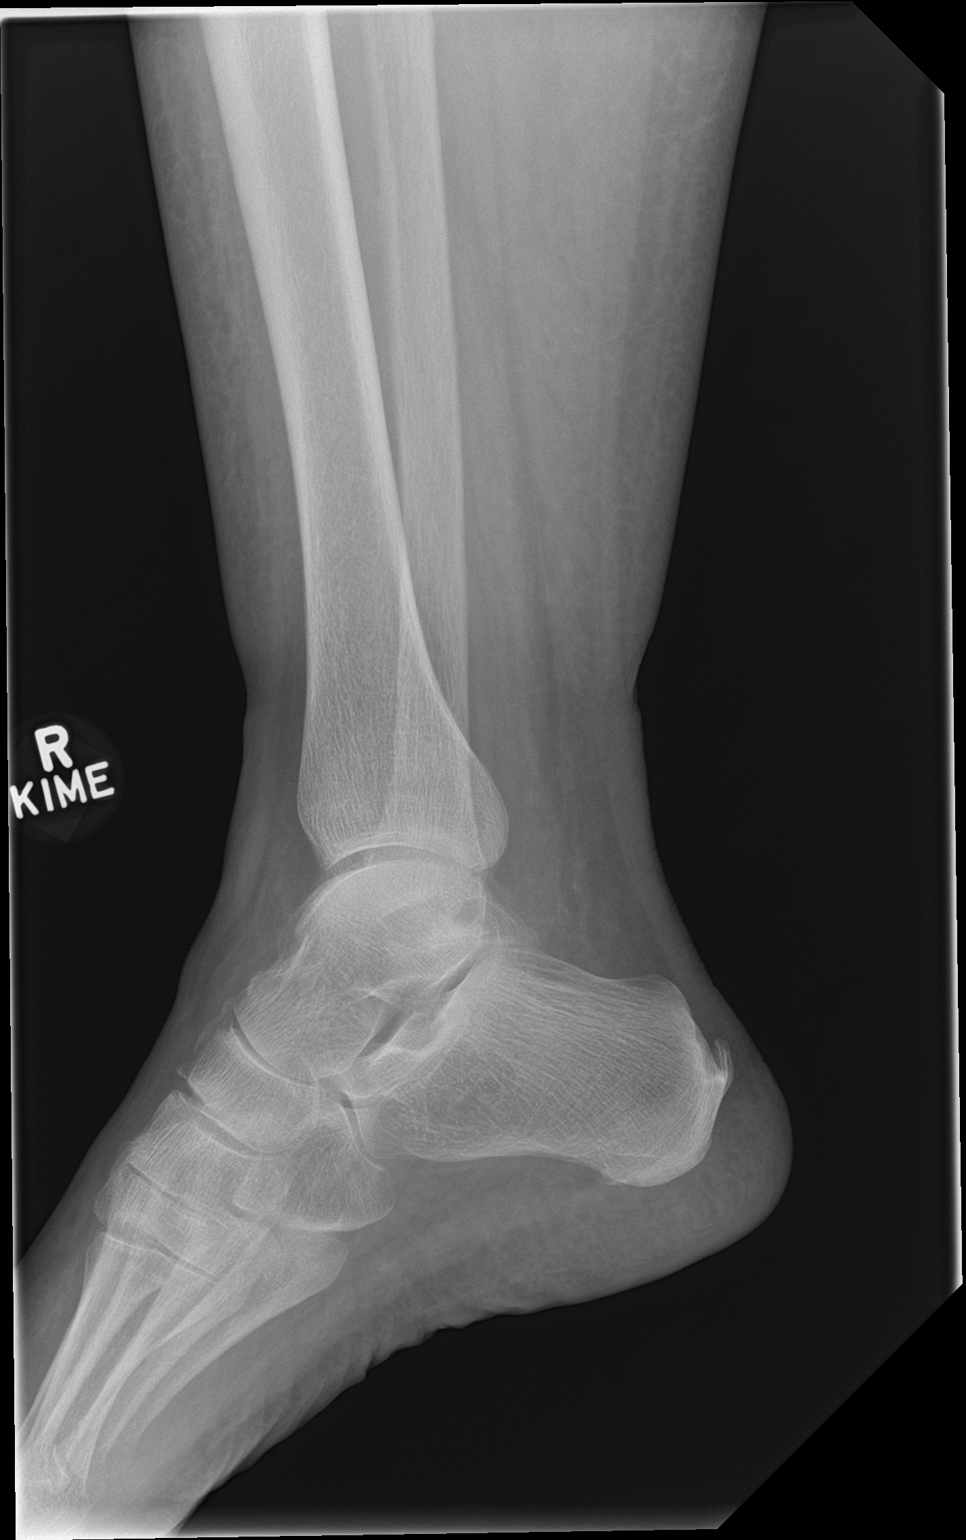

[3 of 3 positions shown; findings below may reference images not displayed]

FINDINGS: Deformity of the distal tip of the fibula is chronic in appearance
consistent with previous injury. No acute fractures are noted. The
ankle mortise is intact. Mild soft tissue swelling.
IMPRESSION: Mild soft tissue swelling.  No acute fracture.

## 2021-02-17 IMAGING — DX RIGHT KNEE - 1-2 VIEW
2 series · 2 of 2 positions shown · non-contrast
Comparison: None.

CLINICAL DATA: Pain after trauma

EXAM:
RIGHT KNEE - 1-2 VIEW

[knee ap]
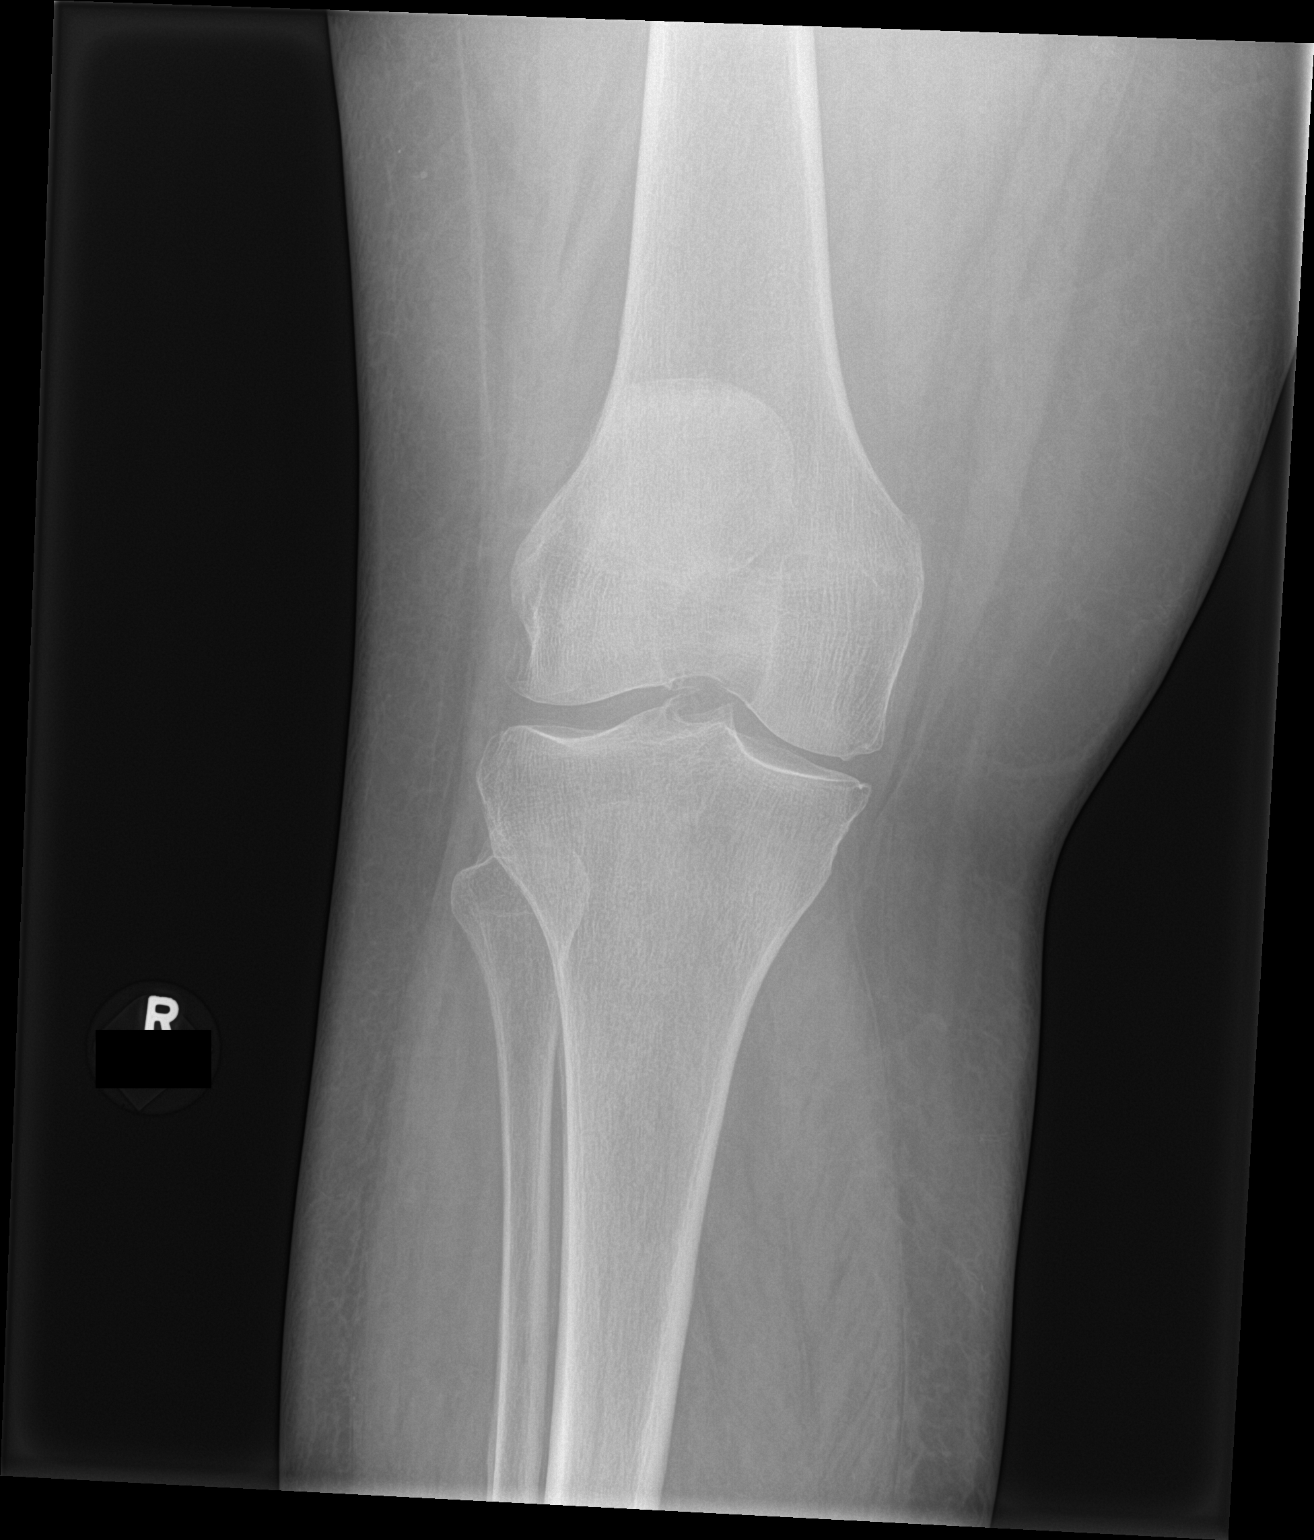

[knee lat]
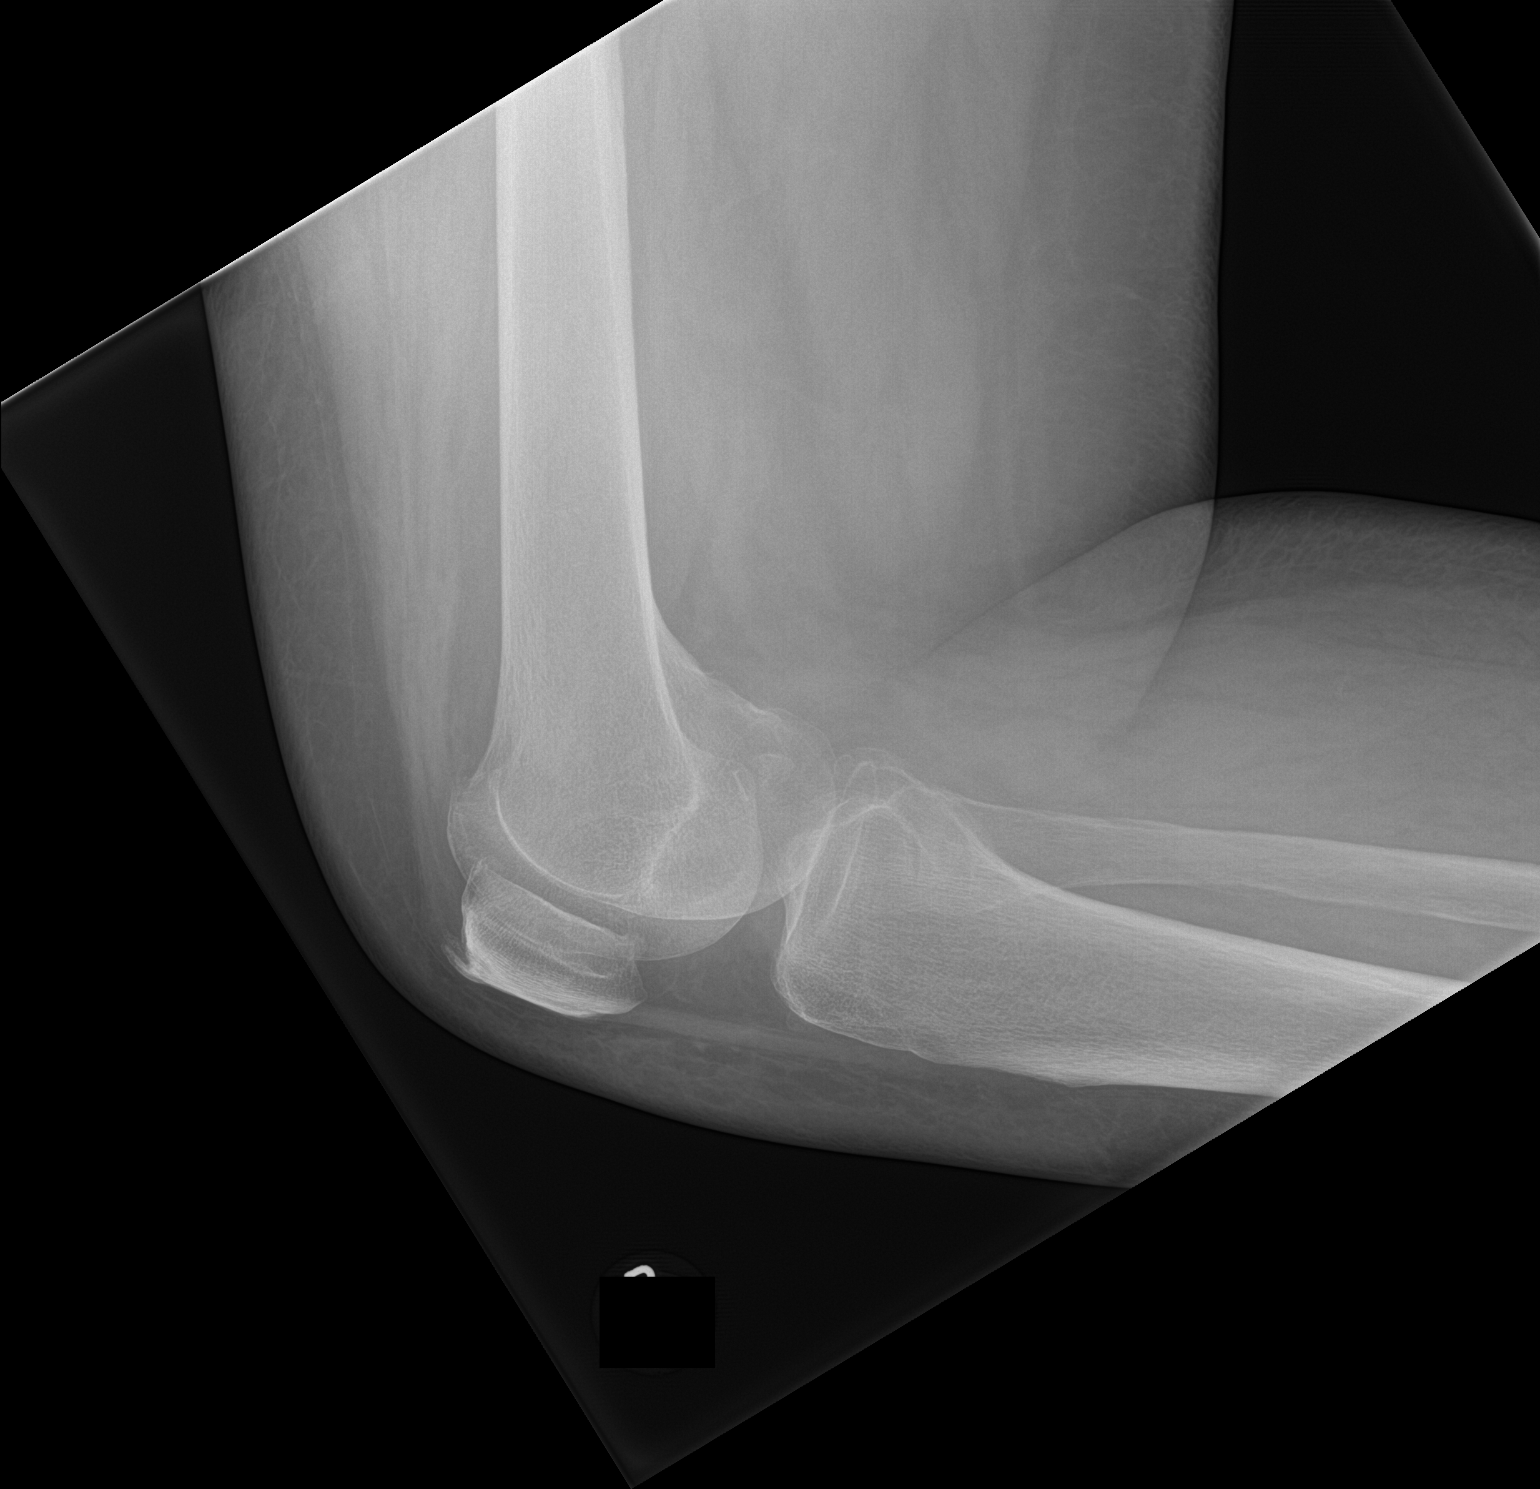

[2 of 2 positions shown; findings below may reference images not displayed]

FINDINGS: Mild enthesopathic changes off of the superior patella. Mild
tricompartmental degenerative changes noted. No fracture or
effusion.
IMPRESSION: Mild degenerative changes.  No fracture or effusion.

## 2021-06-02 ENCOUNTER — Emergency Department (HOSPITAL_BASED_OUTPATIENT_CLINIC_OR_DEPARTMENT_OTHER): Payer: Medicare Other

## 2021-06-02 ENCOUNTER — Other Ambulatory Visit: Payer: Self-pay

## 2021-06-02 ENCOUNTER — Encounter (HOSPITAL_BASED_OUTPATIENT_CLINIC_OR_DEPARTMENT_OTHER): Payer: Self-pay

## 2021-06-02 ENCOUNTER — Emergency Department (HOSPITAL_BASED_OUTPATIENT_CLINIC_OR_DEPARTMENT_OTHER)
Admission: EM | Admit: 2021-06-02 | Discharge: 2021-06-02 | Disposition: A | Discharge status: Home / Self Care | Payer: Medicare Other | Attending: Emergency Medicine | Admitting: Emergency Medicine

## 2021-06-02 DIAGNOSIS — N179 Acute kidney failure, unspecified: Secondary | ICD-10-CM | POA: Insufficient documentation

## 2021-06-02 DIAGNOSIS — R109 Unspecified abdominal pain: Secondary | ICD-10-CM | POA: Insufficient documentation

## 2021-06-02 DIAGNOSIS — N390 Urinary tract infection, site not specified: Secondary | ICD-10-CM | POA: Insufficient documentation

## 2021-06-02 DIAGNOSIS — E860 Dehydration: Secondary | ICD-10-CM | POA: Insufficient documentation

## 2021-06-02 DIAGNOSIS — Z794 Long term (current) use of insulin: Secondary | ICD-10-CM | POA: Insufficient documentation

## 2021-06-02 DIAGNOSIS — R531 Weakness: Secondary | ICD-10-CM | POA: Diagnosis present

## 2021-06-02 DIAGNOSIS — R0602 Shortness of breath: Secondary | ICD-10-CM | POA: Diagnosis not present

## 2021-06-02 DIAGNOSIS — Z79899 Other long term (current) drug therapy: Secondary | ICD-10-CM | POA: Diagnosis not present

## 2021-06-02 LAB — CBC WITH DIFFERENTIAL/PLATELET
Abs Immature Granulocytes: 0.35 10*3/uL — ABNORMAL HIGH (ref 0.00–0.07)
Basophils Absolute: 0.1 10*3/uL (ref 0.0–0.1)
Basophils Relative: 0 %
Eosinophils Absolute: 0 10*3/uL (ref 0.0–0.5)
Eosinophils Relative: 0 %
HCT: 34 % — ABNORMAL LOW (ref 36.0–46.0)
Hemoglobin: 11.1 g/dL — ABNORMAL LOW (ref 12.0–15.0)
Immature Granulocytes: 2 %
Lymphocytes Relative: 5 %
Lymphs Abs: 0.9 10*3/uL (ref 0.7–4.0)
MCH: 29.8 pg (ref 26.0–34.0)
MCHC: 32.6 g/dL (ref 30.0–36.0)
MCV: 91.2 fL (ref 80.0–100.0)
Monocytes Absolute: 1.8 10*3/uL — ABNORMAL HIGH (ref 0.1–1.0)
Monocytes Relative: 10 %
Neutro Abs: 14.5 10*3/uL — ABNORMAL HIGH (ref 1.7–7.7)
Neutrophils Relative %: 83 %
Platelets: 252 10*3/uL (ref 150–400)
RBC: 3.73 MIL/uL — ABNORMAL LOW (ref 3.87–5.11)
RDW: 14.3 % (ref 11.5–15.5)
WBC: 17.7 10*3/uL — ABNORMAL HIGH (ref 4.0–10.5)
nRBC: 0 % (ref 0.0–0.2)

## 2021-06-02 LAB — BRAIN NATRIURETIC PEPTIDE: B Natriuretic Peptide: 292.8 pg/mL — ABNORMAL HIGH (ref 0.0–100.0)

## 2021-06-02 LAB — URINALYSIS, ROUTINE W REFLEX MICROSCOPIC
Bilirubin Urine: NEGATIVE
Glucose, UA: 100 mg/dL — AB
Ketones, ur: NEGATIVE mg/dL
Nitrite: NEGATIVE
Protein, ur: 100 mg/dL — AB
Specific Gravity, Urine: 1.025 (ref 1.005–1.030)
pH: 5 (ref 5.0–8.0)

## 2021-06-02 LAB — COMPREHENSIVE METABOLIC PANEL
ALT: 26 U/L (ref 0–44)
AST: 28 U/L (ref 15–41)
Albumin: 3.1 g/dL — ABNORMAL LOW (ref 3.5–5.0)
Alkaline Phosphatase: 100 U/L (ref 38–126)
Anion gap: 10 (ref 5–15)
BUN: 37 mg/dL — ABNORMAL HIGH (ref 8–23)
CO2: 23 mmol/L (ref 22–32)
Calcium: 8.4 mg/dL — ABNORMAL LOW (ref 8.9–10.3)
Chloride: 102 mmol/L (ref 98–111)
Creatinine, Ser: 2.45 mg/dL — ABNORMAL HIGH (ref 0.44–1.00)
GFR, Estimated: 21 mL/min — ABNORMAL LOW (ref >60)
Glucose, Bld: 174 mg/dL — ABNORMAL HIGH (ref 70–99)
Potassium: 4.4 mmol/L (ref 3.5–5.1)
Sodium: 135 mmol/L (ref 135–145)
Total Bilirubin: 0.4 mg/dL (ref 0.3–1.2)
Total Protein: 7.1 g/dL (ref 6.5–8.1)

## 2021-06-02 LAB — LACTIC ACID, PLASMA
Lactic Acid, Venous: 1.3 mmol/L (ref 0.5–1.9)
Lactic Acid, Venous: 1.3 mmol/L (ref 0.5–1.9)

## 2021-06-02 LAB — URINALYSIS, MICROSCOPIC (REFLEX)
RBC / HPF: >50 RBC/hpf (ref 0–5)
WBC, UA: >50 WBC/hpf (ref 0–5)

## 2021-06-02 LAB — PROTIME-INR
INR: 1.2 (ref 0.8–1.2)
Prothrombin Time: 14.9 seconds (ref 11.4–15.2)

## 2021-06-02 LAB — APTT: aPTT: 21 seconds — ABNORMAL LOW (ref 24–36)

## 2021-06-02 LAB — TROPONIN I (HIGH SENSITIVITY): Troponin I (High Sensitivity): 21 ng/L — ABNORMAL HIGH (ref <18)

## 2021-06-02 MED ORDER — LACTATED RINGERS IV BOLUS (SEPSIS)
500.0000 mL | Freq: Once | INTRAVENOUS | Status: AC
  Administered 2021-06-02: 500 mL via INTRAVENOUS

## 2021-06-02 MED ORDER — CEFPODOXIME PROXETIL 200 MG PO TABS
200.0000 mg | ORAL_TABLET | Freq: Two times a day (BID) | ORAL | 0 refills | Status: DC
Start: 2021-06-02 — End: 2021-06-02

## 2021-06-02 MED ORDER — LACTATED RINGERS IV BOLUS
500.0000 mL | Freq: Once | INTRAVENOUS | Status: AC
  Administered 2021-06-02: 500 mL via INTRAVENOUS

## 2021-06-02 MED ORDER — SODIUM CHLORIDE 0.9 % IV SOLN
2.0000 g | Freq: Once | INTRAVENOUS | Status: AC
  Administered 2021-06-02: 2 g via INTRAVENOUS
  Filled 2021-06-02: qty 20

## 2021-06-02 MED ORDER — OXYCODONE-ACETAMINOPHEN 5-325 MG PO TABS
1.0000 | ORAL_TABLET | Freq: Once | ORAL | Status: AC
  Administered 2021-06-02: 1 via ORAL
  Filled 2021-06-02: qty 1

## 2021-06-02 MED ORDER — CEFPODOXIME PROXETIL 200 MG PO TABS: 200.0000 mg | ORAL_TABLET | Freq: Two times a day (BID) | ORAL | 0 refills | Status: AC

## 2021-06-02 MED ORDER — OXYCODONE-ACETAMINOPHEN 5-325 MG PO TABS: 0.5000 | ORAL_TABLET | Freq: Three times a day (TID) | ORAL | 0 refills | Status: DC | PRN

## 2021-06-02 MED ORDER — OXYCODONE-ACETAMINOPHEN 5-325 MG PO TABS: 0.5000 | ORAL_TABLET | Freq: Three times a day (TID) | ORAL | 0 refills | Status: AC | PRN

## 2021-06-02 NOTE — ED Provider Notes (Signed)
[facility] EMERGENCY DEPARTMENT Provider Note   CSN: [id number] Arrival date & time: 06/02/21  0025     History  Chief Complaint  Patient presents with   Weakness   Knee Pain    Kristy Rios is a 71 y.o. female.  71 year old female that presents to the ER with weakness and knee pain.  Nursing note states that she has flank pain however patient told me multiple times that the reason why she came is that she been sitting in chair relaxing throughout the day she went to stand up she had significant knee pain.  She states that her legs were wobbly and she felt like she could not take a step because she felt weak.  She states he really has not had any flank pain since the procedure she is done pretty well from that standpoint.  She has not noticed any urinary changes.  She states that she is thirsty but some of her medications make her have a dry mouth.  She states that she timing shortness of breath.  She is a chronic cough that is not new.  No fevers.  The son is with her and states that she did have some shaking episode when he got there that sounds a lot like chills.  Patient had previously been on daily Keflex secondary to bacteriuria.  She had stents in place from January for kidney stones.  She had been on Vantin for the last week or so to get rid of her urinary tract infection prior to the procedure on Monday.  No falls.  No focal weakness.  No trauma to cause her knees to be painful.   Weakness Knee Pain     Home Medications Prior to Admission medications   Medication Sig Start Date End Date Taking? Authorizing Provider  ALPRAZolam Prudy Feeler) 0.5 MG tablet Take 0.5 mg by mouth at bedtime as needed for anxiety.    [provider]  bumetanide (BUMEX) 1 MG tablet Take 1 mg by mouth daily.    [provider]  cefpodoxime (VANTIN) 200 MG tablet Take 1 tablet (200 mg total) by mouth 2 (two) times daily for 10 days. 06/02/21 06/12/21  Hussein Macdougal, Barbara Cower, MD   clotrimazole-betamethasone (LOTRISONE) cream Apply 1 application topically 3 (three) times daily.    [provider]  cyclobenzaprine (FLEXERIL) 5 MG tablet 1-2 po up to bid prn muscle relaxer 06/26/18   [provider]  doxazosin (CARDURA) 4 MG tablet TAKE ONE TABLET BY MOUTH ONE TIME DAILY 08/23/17   [provider]  fexofenadine (ALLEGRA) 60 MG tablet Take 60 mg by mouth daily.    [provider]  glucose blood test strip TEST BLOOD SUGAR BEFORE EACH MEAL AND 2 HOURS AFTER ONE MEAL DURING THE DAY AND EVERY NIGHT AT BEDTIME    [provider]  hyoscyamine (LEVSIN, ANASPAZ) 0.125 MG tablet Take 0.125 mg by mouth every 4 (four) hours as needed (nausea).    [provider]  Insulin Degludec (TRESIBA FLEXTOUCH) 200 UNIT/ML SOPN INJECT 80 UNITS SUBCUTANEOUSLY DAILY 09/18/17   [provider]  lidocaine (LIDODERM) 5 % Place 1 patch onto the skin daily as needed. Apply patch to area most significant pain once per day.  Remove and discard patch within 12 hours of application. 03/17/20   Petrucelli, Pleas Koch, PA-C  Liraglutide 18 MG/3ML SOPN Inject 1.8 mg into the skin daily.    [provider]  lisinopril (PRINIVIL,ZESTRIL) 40 MG tablet Take 40 mg by  mouth daily. 03/28/14   [provider]  mometasone (NASONEX) 50 MCG/ACT nasal spray Place 2 sprays into the nose daily.    [provider]  oxyCODONE-acetaminophen (PERCOCET) 5-325 MG tablet Take 0.5-1 tablets by mouth every 8 (eight) hours as needed for severe pain. 06/02/21   Kavian Peters, Barbara Cower, MD  simvastatin (ZOCOR) 40 MG tablet Take 40 mg by mouth daily. 05/15/14   [provider]  verapamil (VERELAN PM) 240 MG 24 hr capsule Take 360 mg by mouth daily.  03/30/14   [provider]  zolpidem (AMBIEN) 5 MG tablet Take 5 mg by mouth at bedtime as needed for sleep.    [provider]      Allergies    Sulfamethoxazole-trimethoprim    Review of Systems    Review of Systems  Neurological:  Positive for weakness.   Physical Exam Updated Vital Signs BP (!) 103/55   Pulse 66   Temp 99.1 F (37.3 C) (Oral)   Resp 19   Ht 5\' 6"  (1.676 m)   Wt 113 kg   SpO2 93%   BMI 40.21 kg/m  Physical Exam Vitals and nursing note reviewed.  Constitutional:      Appearance: She is well-developed.  HENT:     Head: Normocephalic and atraumatic.     Nose: Nose normal. No congestion or rhinorrhea.     Mouth/Throat:     Mouth: Mucous membranes are moist.     Pharynx: Oropharynx is clear.  Cardiovascular:     Rate and Rhythm: Normal rate and regular rhythm.  Pulmonary:     Effort: No respiratory distress.     Breath sounds: No stridor.  Abdominal:     General: There is no distension.  Musculoskeletal:     Cervical back: Normal range of motion.     Comments: Bilateral knees were inspected without any evidence of erythema, warmth, induration, effusions, trauma or rashes to suggest any injuries, septic arthritis or other etiology for her knee pain.  Skin:    General: Skin is warm and dry.  Neurological:     General: No focal deficit present.     Mental Status: She is alert.    ED Results / Procedures / Treatments   Labs (all labs ordered are listed, but only abnormal results are displayed) Labs Reviewed  CBC WITH DIFFERENTIAL/PLATELET - Abnormal; Notable for the following components:      Result Value   WBC 17.7 (*)    RBC 3.73 (*)    Hemoglobin 11.1 (*)    HCT 34.0 (*)    Neutro Abs 14.5 (*)    Monocytes Absolute 1.8 (*)    Abs Immature Granulocytes 0.35 (*)    All other components within normal limits  COMPREHENSIVE METABOLIC PANEL - Abnormal; Notable for the following components:   Glucose, Bld 174 (*)    BUN 37 (*)    Creatinine, Ser 2.45 (*)    Calcium 8.4 (*)    Albumin 3.1 (*)    GFR, Estimated 21 (*)    All other components within normal limits  URINALYSIS, ROUTINE W REFLEX MICROSCOPIC - Abnormal; Notable for the following  components:   APPearance TURBID (*)    Glucose, UA 100 (*)    Hgb urine dipstick LARGE (*)    Protein, ur 100 (*)    Leukocytes,Ua SMALL (*)    All other components within normal limits  BRAIN NATRIURETIC PEPTIDE - Abnormal; Notable for the following components:   B Natriuretic Peptide  292.8 (*)    All other components within normal limits  URINALYSIS, MICROSCOPIC (REFLEX) - Abnormal; Notable for the following components:   Bacteria, UA MANY (*)    All other components within normal limits  APTT - Abnormal; Notable for the following components:   aPTT 21 (*)    All other components within normal limits  TROPONIN I (HIGH SENSITIVITY) - Abnormal; Notable for the following components:   Troponin I (High Sensitivity) 21 (*)    All other components within normal limits  CULTURE, BLOOD (ROUTINE X 2)  CULTURE, BLOOD (ROUTINE X 2)  URINE CULTURE  LACTIC ACID, PLASMA  LACTIC ACID, PLASMA  PROTIME-INR    EKG None  Radiology DG Chest Port 1 View  Result Date: 06/02/2021 CLINICAL DATA:  Questionable sepsis. EXAM: PORTABLE CHEST 1 VIEW COMPARISON:  Chest radiograph dated 12/07/2020. FINDINGS: Mild cardiomegaly and mild central vascular congestion. No focal consolidation, pleural effusion or pneumothorax. Atherosclerotic calcification of the aorta. No acute osseous pathology. IMPRESSION: Mild cardiomegaly and mild central vascular congestion. No focal consolidation. Electronically Signed   By: Elgie Collard M.D.   On: 06/02/2021 01:48   CT Renal Stone Study  Result Date: 06/02/2021 CLINICAL DATA:  Right flank pain.  Concern for kidney stone. EXAM: CT ABDOMEN AND PELVIS WITHOUT CONTRAST TECHNIQUE: Multidetector CT imaging of the abdomen and pelvis was performed following the standard protocol without IV contrast. RADIATION DOSE REDUCTION: This exam was performed according to the departmental dose-optimization program which includes automated exposure control, adjustment of the mA and/or kV  according to patient size and/or use of iterative reconstruction technique. COMPARISON:  CT abdomen pelvis dated 01/26/2014. FINDINGS: Evaluation of this exam is limited in the absence of intravenous contrast. Lower chest: Bibasilar atelectasis/scarring. Partially visualized small pericardial effusion. No intra-abdominal free air or free fluid. Hepatobiliary: The liver is unremarkable. No intrahepatic biliary dilatation. Cholecystectomy. No retained calcified stone noted in the central CBD. Pancreas: Unremarkable. No pancreatic ductal dilatation or surrounding inflammatory changes. Spleen: Normal in size without focal abnormality. Adrenals/Urinary Tract: The adrenal glands unremarkable. There is no hydronephrosis or nephrolithiasis on the left. The left ureter appears unremarkable. There is a 3 mm nonobstructing right renal interpolar calculus. There is mild right hydronephrosis. A pigtail right ureteral stent with proximal tip in the renal pelvis and distal end within the urinary bladder. No stone noted along the course of the stent. A cluster of small stones with combined length of 11 mm noted layering along the posterior bladder wall adjacent to the distal end of the pigtail catheter. Air within the urinary bladder, likely related to recent instrumentation. Overall significant interval improvement in the right hydronephrosis compared to prior CT. Additionally the previously seen 11 mm stone in the upper pole of the right kidney and the stones in the mid and distal right ureter are no longer seen on today's exam. Stomach/Bowel: There is sigmoid diverticulosis without active inflammatory changes. There is moderate stool throughout the colon. There is no bowel obstruction or active inflammation. The appendix is normal. Vascular/Lymphatic: Mild aortoiliac atherosclerotic disease. The IVC is unremarkable. No portal venous gas. There is no adenopathy. Reproductive: The uterus and ovaries are grossly unremarkable. No  adnexal masses. Other: None Musculoskeletal: Osteopenia with degenerative changes of the spine. No acute osseous pathology. IMPRESSION: 1. Interval placement of a right pigtail ureteral stent with significant interval improvement in the right hydronephrosis compared to prior CT. No obstructing stone. 2. Sigmoid diverticulosis. No bowel obstruction. Normal appendix. 3. Aortic Atherosclerosis (ICD10-I70.0).  Electronically Signed   By: Elgie Collard M.D.   On: 06/02/2021 03:39    Procedures Procedures    Medications Ordered in ED Medications  lactated ringers bolus 500 mL (0 mLs Intravenous Stopped 06/02/21 0211)  oxyCODONE-acetaminophen (PERCOCET/ROXICET) 5-325 MG per tablet 1 tablet (1 tablet Oral Given 06/02/21 0256)  lactated ringers bolus 500 mL (0 mLs Intravenous Stopped 06/02/21 0344)  cefTRIAXone (ROCEPHIN) 2 g in sodium chloride 0.9 % 100 mL IVPB (0 g Intravenous Stopped 06/02/21 0436)    ED Course/ Medical Decision Making/ A&P                           Medical Decision Making Amount and/or Complexity of Data Reviewed Labs: ordered. Radiology: ordered. ECG/medicine tests: ordered.  Risk Prescription drug management.   Ultimately patient was found to have stable stent, her stones are pretty much resolved but does have likely UTI.  Kidney functions bumped a little bit consistent with dehydration which is also consistent with her clinical exam.  I consider talking to her urologist however did not feel like there was any indication to be admitted to the hospital patient states that she could call her urologist later in the day to set up an appointment.  She also has a nephrologist that can recheck her kidneys.  I gave her 500 cc of fluid and reevaluated her and she was not in any respiratory distress or change in respiratory status another 500 cc were given.  She was given a dose of Rocephin here and pain medication.  She is able to ambulate around the department out difficulty and she is  felt like she was at her baseline.  I do not feel that hospitalization was indicated at that time.  I suspect that she was just mildly dehydrated from this recent procedure and urinary tract infection and thus should not get out of the chair much and had some arthritic type knee pain that resolved.  She will return here for any new or worsening symptoms otherwise patient stable for discharge with outpatient follow-up  Final Clinical Impression(s) / ED Diagnoses Final diagnoses:  Dehydration  Lower urinary tract infectious disease  AKI (acute kidney injury) (HCC)    Rx / DC Orders ED Discharge Orders          Ordered    oxyCODONE-acetaminophen (PERCOCET) 5-325 MG tablet  Every 8 hours PRN,   Status:  Discontinued        06/02/21 0446    cefpodoxime (VANTIN) 200 MG tablet  2 times daily,   Status:  Discontinued        06/02/21 0446    cefpodoxime (VANTIN) 200 MG tablet  2 times daily        06/02/21 0448    oxyCODONE-acetaminophen (PERCOCET) 5-325 MG tablet  Every 8 hours PRN        06/02/21 0448              Rajesh Wyss, Barbara Cower, MD 06/02/21 [phone]

## 2021-06-02 NOTE — ED Triage Notes (Signed)
Pt to ED by EMS from home with c/o R sided flank pain. Pt had lithotripsy performed on Monday. Pt endorses lethargy, VSS, NADN. ?

## 2021-06-05 LAB — URINE CULTURE: Culture: 2000 — AB

## 2021-06-06 ENCOUNTER — Telehealth (HOSPITAL_BASED_OUTPATIENT_CLINIC_OR_DEPARTMENT_OTHER): Payer: Self-pay | Admitting: *Deleted

## 2021-06-06 NOTE — Telephone Encounter (Signed)
Post ED Visit - Positive Culture Follow-up: Successful Patient Follow-Up ? ?Culture assessed and recommendations reviewed by: ? ?[]  Elenor Quinones, Pharm.D. ?[]  Heide Guile, Pharm.D., BCPS AQ-ID ?[]  Parks Neptune, Pharm.D., BCPS ?[]  Alycia Rossetti, Pharm.D., BCPS ?[]  [place], Pharm.D., BCPS, AAHIVP ?[]  Legrand Como, Pharm.D., BCPS, AAHIVP ?[]  Salome Arnt, PharmD, BCPS ?[]  Johnnette Gourd, PharmD, BCPS ?[]  Hughes Better, PharmD, BCPS ?[x] Bertis Ruddy, PharmD ? ?Positive urine culture ? ?Pt having some mild urinary symptoms ? ?Changes discussed with ED provider: Paulita Cradle, PA ?New prescription Fluconazole 100mg  Daily x 7 days ?Called to [facility], [place], [place] ? ?Contacted patient, date 06/06/21, time 1206 ? ? ?Kristy Rios ?06/06/2021, 12:07 PM ? ?  ?

## 2021-06-06 NOTE — Progress Notes (Signed)
ED Antimicrobial Stewardship Positive Culture Follow Up  ? ?Kristy Rios is an 71 y.o. female who presented to [facility] on 06/02/2021 with a chief complaint of  ?Chief Complaint  ?Patient presents with  ? Weakness  ? Knee Pain  ? ? ?Recent Results (from the past 720 hour(s))  ?Blood Culture (routine x 2)     Status: None (Preliminary result)  ? Collection Time: 06/02/21  2:02 AM  ? Specimen: BLOOD  ?Result Value Ref Range Status  ? Specimen Description   Final  ?  BLOOD LEFT ANTECUBITAL ?Performed at [facility], [address] [place], [place] [address] ?  ? Special Requests   Final  ?  BOTTLES DRAWN AEROBIC AND ANAEROBIC Blood Culture results may not be optimal due to an inadequate volume of blood received in culture bottles ?Performed at [facility], [address], [place], [place] [address] ?  ? Culture   Final  ?  NO GROWTH 4 DAYS ?Performed at [facility], [place] [address] [place], [place] [address] ?  ? Report Status PENDING  Incomplete  ?Urine Culture     Status: Abnormal  ? Collection Time: 06/02/21  2:03 AM  ? Specimen: In/Out Cath Urine  ?Result Value Ref Range Status  ? Specimen Description   Final  ?  IN/OUT CATH URINE ?Performed at [facility], [address] [place], [place] [address] ?  ? Special Requests   Final  ?  NONE ?Performed at [facility], [address], [place], [place] [address] ?  ? Culture (A)  Final  ?  2,000 COLONIES/mL STAPHYLOCOCCUS EPIDERMIDIS ?4,000 COLONIES/mL YEAST ?  ? Report Status 06/05/2021 FINAL  Final  ? Organism ID, Bacteria STAPHYLOCOCCUS EPIDERMIDIS (A)  Final  ?    Susceptibility  ? Staphylococcus epidermidis - MIC*  ?  CIPROFLOXACIN >=8 RESISTANT Resistant   ?  GENTAMICIN >=16 RESISTANT Resistant   ?  NITROFURANTOIN <=16 SENSITIVE Sensitive   ?  OXACILLIN >=4 RESISTANT Resistant   ?  TETRACYCLINE 2 SENSITIVE Sensitive   ?  VANCOMYCIN 1 SENSITIVE Sensitive   ?  TRIMETH/SULFA <=10 SENSITIVE Sensitive   ?   CLINDAMYCIN <=0.25 SENSITIVE Sensitive   ?  RIFAMPIN <=0.5 SENSITIVE Sensitive   ?  Inducible Clindamycin NEGATIVE Sensitive   ?  * 2,000 COLONIES/mL STAPHYLOCOCCUS EPIDERMIDIS  ?Blood Culture (routine x 2)     Status: None (Preliminary result)  ? Collection Time: 06/02/21  3:09 AM  ? Specimen: BLOOD  ?Result Value Ref Range Status  ? Specimen Description   Final  ?  BLOOD RIGHT ANTECUBITAL ?Performed at [facility], [address], [place], [place] [address] ?  ? Special Requests   Final  ?  BOTTLES DRAWN AEROBIC AND ANAEROBIC Blood Culture adequate volume ?Performed at [facility], [address], [place], [place] [address] ?  ? Culture   Final  ?  NO GROWTH 4 DAYS ?Performed at [facility], [place] [address], [place], [place] [address] ?  ? Report Status PENDING  Incomplete  ? ? ?Plan: Call for sx check given minimal growth, complicated hx and recent candida UTI per urology, if sx positive give fluconazole 100mg  reduced dose for renal fxn ? ?ED Provider: Paulita Cradle, PA-C ? ? ?Bertis Ruddy, PharmD ?Clinical Pharmacist ?ED Pharmacist Phone # [phone] ?06/06/2021 10:54 AM ? ? ?

## 2021-06-07 LAB — CULTURE, BLOOD (ROUTINE X 2)
Culture: NO GROWTH
Culture: NO GROWTH
Special Requests: ADEQUATE

## 2022-04-20 ENCOUNTER — Other Ambulatory Visit (HOSPITAL_COMMUNITY): Payer: Self-pay

## 2022-04-22 ENCOUNTER — Other Ambulatory Visit (HOSPITAL_COMMUNITY): Payer: Self-pay

## 2022-04-25 ENCOUNTER — Other Ambulatory Visit (HOSPITAL_COMMUNITY): Payer: Self-pay

## 2022-04-27 ENCOUNTER — Other Ambulatory Visit (HOSPITAL_COMMUNITY): Payer: Self-pay

## 2022-04-27 MED ORDER — METOPROLOL TARTRATE 50 MG PO TABS
75.0000 mg | ORAL_TABLET | Freq: Two times a day (BID) | ORAL | 1 refills | Status: DC
  Filled 2022-06-06: qty 270, 90d supply, fill #0

## 2022-04-27 MED ORDER — DULOXETINE HCL 60 MG PO CPEP
60.0000 mg | ORAL_CAPSULE | Freq: Every day | ORAL | 1 refills | Status: DC
  Filled 2022-06-06: qty 90, 90d supply, fill #0

## 2022-04-27 MED ORDER — INSULIN LISPRO (1 UNIT DIAL) 100 UNIT/ML (KWIKPEN)
8.0000 [IU] | PEN_INJECTOR | Freq: Three times a day (TID) | SUBCUTANEOUS | 1 refills | Status: AC
  Filled 2022-04-27: qty 15, 63d supply, fill #0
  Filled 2022-06-28: qty 6, 25d supply, fill #0
  Filled 2022-08-07: qty 6, 25d supply, fill #1

## 2022-04-27 MED ORDER — NITROFURANTOIN MACROCRYSTAL 100 MG PO CAPS
100.0000 mg | ORAL_CAPSULE | Freq: Every day | ORAL | 3 refills | Status: AC
  Filled 2022-06-06: qty 90, 90d supply, fill #0
  Filled 2022-10-10: qty 90, 90d supply, fill #1

## 2022-04-27 MED ORDER — ROSUVASTATIN CALCIUM 10 MG PO TABS
10.0000 mg | ORAL_TABLET | Freq: Every day | ORAL | 3 refills | Status: DC
  Filled 2022-04-27 – 2022-05-03 (×2): qty 90, 90d supply, fill #0
  Filled 2022-08-07: qty 90, 90d supply, fill #1

## 2022-04-27 MED ORDER — SOLIFENACIN SUCCINATE 10 MG PO TABS
10.0000 mg | ORAL_TABLET | Freq: Every day | ORAL | 3 refills | Status: AC
  Filled 2022-04-27 – 2022-06-06 (×2): qty 90, 90d supply, fill #0

## 2022-04-27 MED ORDER — INSULIN DEGLUDEC 200 UNIT/ML ~~LOC~~ SOPN
50.0000 [IU] | PEN_INJECTOR | Freq: Every day | SUBCUTANEOUS | 2 refills | Status: AC
  Filled 2022-06-06: qty 27, 72d supply, fill #0

## 2022-04-28 ENCOUNTER — Other Ambulatory Visit: Payer: Self-pay

## 2022-04-28 ENCOUNTER — Other Ambulatory Visit (HOSPITAL_COMMUNITY): Payer: Self-pay

## 2022-04-29 ENCOUNTER — Other Ambulatory Visit (HOSPITAL_COMMUNITY): Payer: Self-pay

## 2022-04-29 ENCOUNTER — Other Ambulatory Visit: Payer: Self-pay

## 2022-04-29 ENCOUNTER — Encounter (HOSPITAL_COMMUNITY): Payer: Self-pay

## 2022-05-03 ENCOUNTER — Other Ambulatory Visit: Payer: Self-pay

## 2022-05-03 ENCOUNTER — Other Ambulatory Visit (HOSPITAL_COMMUNITY): Payer: Self-pay

## 2022-05-04 ENCOUNTER — Other Ambulatory Visit (HOSPITAL_COMMUNITY): Payer: Self-pay

## 2022-06-06 ENCOUNTER — Other Ambulatory Visit: Payer: Self-pay

## 2022-06-07 ENCOUNTER — Other Ambulatory Visit: Payer: Self-pay

## 2022-06-07 ENCOUNTER — Other Ambulatory Visit (HOSPITAL_COMMUNITY): Payer: Self-pay

## 2022-06-15 ENCOUNTER — Other Ambulatory Visit (HOSPITAL_BASED_OUTPATIENT_CLINIC_OR_DEPARTMENT_OTHER): Payer: Self-pay

## 2022-06-15 ENCOUNTER — Other Ambulatory Visit: Payer: Self-pay

## 2022-06-15 ENCOUNTER — Other Ambulatory Visit (HOSPITAL_COMMUNITY): Payer: Self-pay

## 2022-06-15 MED ORDER — VERAPAMIL HCL ER 180 MG PO TBCR
180.0000 mg | EXTENDED_RELEASE_TABLET | Freq: Two times a day (BID) | ORAL | 1 refills | Status: DC
Start: 2022-06-15 — End: 2023-01-02
  Filled 2022-06-15 (×2): qty 180, 90d supply, fill #0
  Filled 2022-10-10: qty 180, 90d supply, fill #1

## 2022-06-15 MED ORDER — DOXAZOSIN MESYLATE 8 MG PO TABS
8.0000 mg | ORAL_TABLET | Freq: Every day | ORAL | 1 refills | Status: DC
  Filled 2022-06-15 (×2): qty 90, 90d supply, fill #0
  Filled 2022-10-10: qty 90, 90d supply, fill #1

## 2022-06-15 MED ORDER — CLONIDINE HCL 0.1 MG PO TABS
0.1000 mg | ORAL_TABLET | Freq: Two times a day (BID) | ORAL | 1 refills | Status: DC
  Filled 2022-06-15 (×2): qty 180, 90d supply, fill #0
  Filled 2022-10-27: qty 180, 90d supply, fill #1

## 2022-06-16 ENCOUNTER — Other Ambulatory Visit (HOSPITAL_COMMUNITY): Payer: Self-pay

## 2022-06-16 MED ORDER — MOUNJARO 7.5 MG/0.5ML ~~LOC~~ SOAJ
7.5000 mg | SUBCUTANEOUS | 3 refills | Status: DC
  Filled 2022-06-16 – 2022-06-28 (×3): qty 2, 28d supply, fill #0

## 2022-06-17 ENCOUNTER — Other Ambulatory Visit (HOSPITAL_COMMUNITY): Payer: Self-pay

## 2022-06-20 ENCOUNTER — Other Ambulatory Visit: Payer: Self-pay

## 2022-06-20 ENCOUNTER — Other Ambulatory Visit (HOSPITAL_COMMUNITY): Payer: Self-pay

## 2022-06-28 ENCOUNTER — Other Ambulatory Visit: Payer: Self-pay

## 2022-06-28 ENCOUNTER — Other Ambulatory Visit (HOSPITAL_COMMUNITY): Payer: Self-pay

## 2022-06-28 MED ORDER — ALPRAZOLAM 0.5 MG PO TABS
0.5000 mg | ORAL_TABLET | Freq: Every evening | ORAL | 0 refills | Status: DC | PRN
  Filled 2022-06-28: qty 90, 90d supply, fill #0

## 2022-06-29 ENCOUNTER — Other Ambulatory Visit: Payer: Self-pay

## 2022-06-29 ENCOUNTER — Other Ambulatory Visit (HOSPITAL_COMMUNITY): Payer: Self-pay

## 2022-07-04 ENCOUNTER — Encounter: Payer: Self-pay | Admitting: *Deleted

## 2022-07-18 ENCOUNTER — Other Ambulatory Visit (HOSPITAL_COMMUNITY): Payer: Self-pay

## 2022-07-19 ENCOUNTER — Other Ambulatory Visit: Payer: Self-pay

## 2022-07-19 ENCOUNTER — Other Ambulatory Visit (HOSPITAL_COMMUNITY): Payer: Self-pay

## 2022-07-19 ENCOUNTER — Encounter: Payer: Self-pay | Admitting: Pharmacist

## 2022-08-08 ENCOUNTER — Other Ambulatory Visit: Payer: Self-pay

## 2022-08-10 ENCOUNTER — Other Ambulatory Visit (HOSPITAL_COMMUNITY): Payer: Self-pay

## 2022-08-10 MED ORDER — MOUNJARO 10 MG/0.5ML ~~LOC~~ SOAJ
10.0000 mg | SUBCUTANEOUS | 0 refills | Status: DC
  Filled 2022-08-10: qty 2, 28d supply, fill #0

## 2022-08-27 ENCOUNTER — Other Ambulatory Visit (HOSPITAL_COMMUNITY): Payer: Self-pay

## 2022-08-29 ENCOUNTER — Other Ambulatory Visit (HOSPITAL_COMMUNITY): Payer: Self-pay

## 2022-08-29 ENCOUNTER — Other Ambulatory Visit: Payer: Self-pay

## 2022-08-29 MED ORDER — ASPIRIN 81 MG PO TBEC
81.0000 mg | DELAYED_RELEASE_TABLET | Freq: Every day | ORAL | 0 refills | Status: AC
  Filled 2022-08-29: qty 90, 90d supply, fill #0

## 2022-08-29 MED ORDER — BUMETANIDE 1 MG PO TABS
1.0000 mg | ORAL_TABLET | Freq: Every day | ORAL | 0 refills | Status: DC
  Filled 2022-08-29: qty 90, 90d supply, fill #0

## 2022-08-30 ENCOUNTER — Other Ambulatory Visit (HOSPITAL_COMMUNITY): Payer: Self-pay

## 2022-08-30 ENCOUNTER — Other Ambulatory Visit: Payer: Self-pay

## 2022-09-08 ENCOUNTER — Other Ambulatory Visit (HOSPITAL_COMMUNITY): Payer: Self-pay

## 2022-09-09 ENCOUNTER — Other Ambulatory Visit (HOSPITAL_COMMUNITY): Payer: Self-pay

## 2022-09-17 ENCOUNTER — Other Ambulatory Visit (HOSPITAL_COMMUNITY): Payer: Self-pay

## 2022-09-17 MED ORDER — INSULIN LISPRO (1 UNIT DIAL) 100 UNIT/ML (KWIKPEN)
8.0000 [IU] | PEN_INJECTOR | Freq: Three times a day (TID) | SUBCUTANEOUS | 1 refills | Status: DC
  Filled 2022-09-17: qty 15, 63d supply, fill #0
  Filled 2023-04-03: qty 15, 63d supply, fill #1

## 2022-09-17 MED ORDER — MOUNJARO 12.5 MG/0.5ML ~~LOC~~ SOAJ
12.5000 mg | SUBCUTANEOUS | 1 refills | Status: DC
  Filled 2022-09-17 – 2022-09-23 (×3): qty 2, 28d supply, fill #0
  Filled 2023-01-19: qty 2, 28d supply, fill #1
  Filled 2023-04-03: qty 2, 28d supply, fill #2
  Filled 2023-05-20: qty 2, 28d supply, fill #3
  Filled 2023-06-14: qty 2, 28d supply, fill #4
  Filled 2023-06-30 – 2023-07-17 (×2): qty 2, 28d supply, fill #5

## 2022-09-17 MED ORDER — INSULIN DEGLUDEC FLEXTOUCH 200 UNIT/ML ~~LOC~~ SOPN
72.0000 [IU] | PEN_INJECTOR | Freq: Every day | SUBCUTANEOUS | 2 refills | Status: AC
  Filled 2022-09-17 – 2022-09-23 (×2): qty 30, 83d supply, fill #0
  Filled 2023-05-20 – 2023-06-08 (×2): qty 30, 83d supply, fill #1
  Filled 2023-08-27: qty 30, 83d supply, fill #2

## 2022-09-21 ENCOUNTER — Other Ambulatory Visit (HOSPITAL_COMMUNITY): Payer: Self-pay

## 2022-09-23 ENCOUNTER — Other Ambulatory Visit (HOSPITAL_COMMUNITY): Payer: Self-pay

## 2022-09-23 ENCOUNTER — Other Ambulatory Visit: Payer: Self-pay

## 2022-09-28 ENCOUNTER — Other Ambulatory Visit (HOSPITAL_COMMUNITY): Payer: Self-pay

## 2022-10-07 ENCOUNTER — Other Ambulatory Visit (HOSPITAL_COMMUNITY): Payer: Self-pay

## 2022-10-08 ENCOUNTER — Other Ambulatory Visit (HOSPITAL_COMMUNITY): Payer: Self-pay

## 2022-10-08 MED ORDER — ALBUTEROL SULFATE HFA 108 (90 BASE) MCG/ACT IN AERS
2.0000 | INHALATION_SPRAY | RESPIRATORY_TRACT | 10 refills | Status: DC | PRN
  Filled 2022-10-08: qty 8.5, 17d supply, fill #0
  Filled 2023-01-01: qty 6.7, 17d supply, fill #1
  Filled 2023-07-26: qty 8.5, 17d supply, fill #2
  Filled 2023-08-01 – 2023-08-27 (×2): qty 8.5, 17d supply, fill #3

## 2022-10-08 MED ORDER — DULERA 100-5 MCG/ACT IN AERO
2.0000 | INHALATION_SPRAY | Freq: Two times a day (BID) | RESPIRATORY_TRACT | 5 refills | Status: AC
  Filled 2022-10-08 – 2023-07-26 (×2): qty 13, 30d supply, fill #0

## 2022-10-10 ENCOUNTER — Other Ambulatory Visit: Payer: Self-pay

## 2022-10-10 ENCOUNTER — Other Ambulatory Visit (HOSPITAL_COMMUNITY): Payer: Self-pay

## 2022-10-10 MED ORDER — METOPROLOL TARTRATE 50 MG PO TABS
75.0000 mg | ORAL_TABLET | Freq: Two times a day (BID) | ORAL | 1 refills | Status: DC
  Filled 2022-10-10: qty 270, 90d supply, fill #0
  Filled 2023-04-03: qty 270, 90d supply, fill #1

## 2022-10-10 MED ORDER — ROSUVASTATIN CALCIUM 10 MG PO TABS
10.0000 mg | ORAL_TABLET | Freq: Every day | ORAL | 0 refills | Status: DC
  Filled 2022-10-10 – 2022-10-27 (×2): qty 90, 90d supply, fill #0

## 2022-10-10 MED ORDER — DULOXETINE HCL 60 MG PO CPEP
60.0000 mg | ORAL_CAPSULE | Freq: Every day | ORAL | 0 refills | Status: DC
  Filled 2022-10-10: qty 90, 90d supply, fill #0

## 2022-10-11 ENCOUNTER — Other Ambulatory Visit: Payer: Self-pay

## 2022-10-13 ENCOUNTER — Other Ambulatory Visit (HOSPITAL_COMMUNITY): Payer: Self-pay

## 2022-10-13 MED ORDER — NOVOTWIST PEN NEEDLE 32G X 5 MM MISC
1 refills | Status: DC
  Filled 2022-10-13: qty 100, 25d supply, fill #0
  Filled 2022-10-14: qty 100, 90d supply, fill #0
  Filled 2023-06-14: qty 100, 90d supply, fill #1

## 2022-10-14 ENCOUNTER — Other Ambulatory Visit (HOSPITAL_COMMUNITY): Payer: Self-pay

## 2022-10-14 ENCOUNTER — Other Ambulatory Visit: Payer: Self-pay

## 2022-10-28 ENCOUNTER — Other Ambulatory Visit (HOSPITAL_COMMUNITY): Payer: Self-pay

## 2022-11-09 ENCOUNTER — Other Ambulatory Visit (HOSPITAL_COMMUNITY): Payer: Self-pay

## 2022-11-09 MED ORDER — TAMSULOSIN HCL 0.4 MG PO CAPS
0.4000 mg | ORAL_CAPSULE | Freq: Every day | ORAL | 1 refills | Status: AC
  Filled 2022-11-09: qty 30, 30d supply, fill #0
  Filled 2023-03-06: qty 30, 30d supply, fill #1

## 2022-11-10 ENCOUNTER — Other Ambulatory Visit: Payer: Self-pay

## 2022-11-11 ENCOUNTER — Other Ambulatory Visit (HOSPITAL_COMMUNITY): Payer: Self-pay

## 2023-01-01 ENCOUNTER — Other Ambulatory Visit (HOSPITAL_COMMUNITY): Payer: Self-pay

## 2023-01-02 ENCOUNTER — Other Ambulatory Visit (HOSPITAL_COMMUNITY): Payer: Self-pay

## 2023-01-02 ENCOUNTER — Other Ambulatory Visit: Payer: Self-pay

## 2023-01-02 MED ORDER — DOXAZOSIN MESYLATE 8 MG PO TABS
8.0000 mg | ORAL_TABLET | Freq: Every day | ORAL | 1 refills | Status: DC
  Filled 2023-01-02: qty 90, 90d supply, fill #0
  Filled 2023-03-06 – 2023-03-23 (×2): qty 90, 90d supply, fill #1

## 2023-01-02 MED ORDER — VERAPAMIL HCL ER 180 MG PO TBCR
180.0000 mg | EXTENDED_RELEASE_TABLET | Freq: Two times a day (BID) | ORAL | 1 refills | Status: DC
  Filled 2023-01-02: qty 180, 90d supply, fill #0
  Filled 2023-04-03: qty 180, 90d supply, fill #1

## 2023-01-02 MED ORDER — DULOXETINE HCL 60 MG PO CPEP
60.0000 mg | ORAL_CAPSULE | Freq: Every day | ORAL | 0 refills | Status: DC
  Filled 2023-01-02: qty 90, 90d supply, fill #0

## 2023-01-03 ENCOUNTER — Other Ambulatory Visit (HOSPITAL_COMMUNITY): Payer: Self-pay

## 2023-01-09 ENCOUNTER — Encounter (HOSPITAL_COMMUNITY): Payer: Self-pay

## 2023-01-09 ENCOUNTER — Other Ambulatory Visit: Payer: Self-pay

## 2023-01-09 ENCOUNTER — Other Ambulatory Visit (HOSPITAL_COMMUNITY): Payer: Self-pay

## 2023-01-09 MED ORDER — INSULIN DEGLUDEC FLEXTOUCH 200 UNIT/ML ~~LOC~~ SOPN
72.0000 [IU] | PEN_INJECTOR | Freq: Every day | SUBCUTANEOUS | 0 refills | Status: DC
  Filled 2023-01-09: qty 30, 83d supply, fill #0

## 2023-01-10 ENCOUNTER — Other Ambulatory Visit (HOSPITAL_COMMUNITY): Payer: Self-pay

## 2023-01-10 ENCOUNTER — Other Ambulatory Visit: Payer: Self-pay

## 2023-01-10 MED ORDER — CIPROFLOXACIN HCL 500 MG PO TABS
500.0000 mg | ORAL_TABLET | Freq: Two times a day (BID) | ORAL | 0 refills | Status: AC
  Filled 2023-01-10: qty 10, 5d supply, fill #0

## 2023-01-19 ENCOUNTER — Other Ambulatory Visit: Payer: Self-pay

## 2023-01-24 ENCOUNTER — Other Ambulatory Visit (HOSPITAL_COMMUNITY): Payer: Self-pay

## 2023-01-24 MED ORDER — ZOLPIDEM TARTRATE ER 6.25 MG PO TBCR
6.2500 mg | EXTENDED_RELEASE_TABLET | Freq: Every evening | ORAL | 0 refills | Status: DC
  Filled 2023-01-24: qty 30, 30d supply, fill #0

## 2023-02-09 ENCOUNTER — Other Ambulatory Visit (HOSPITAL_COMMUNITY): Payer: Self-pay

## 2023-02-09 ENCOUNTER — Other Ambulatory Visit: Payer: Self-pay

## 2023-02-09 MED ORDER — METHENAMINE HIPPURATE 1 G PO TABS
1.0000 g | ORAL_TABLET | Freq: Every day | ORAL | 5 refills | Status: DC
  Filled 2023-02-09: qty 30, 30d supply, fill #0
  Filled 2023-03-06: qty 90, 90d supply, fill #1
  Filled 2023-05-20: qty 30, 30d supply, fill #2
  Filled 2023-06-26 – 2023-08-01 (×2): qty 30, 30d supply, fill #3

## 2023-03-06 ENCOUNTER — Other Ambulatory Visit (HOSPITAL_COMMUNITY): Payer: Self-pay

## 2023-03-07 ENCOUNTER — Other Ambulatory Visit: Payer: Self-pay

## 2023-03-07 ENCOUNTER — Other Ambulatory Visit (HOSPITAL_COMMUNITY): Payer: Self-pay

## 2023-03-07 MED ORDER — CLONIDINE HCL 0.1 MG PO TABS
0.1000 mg | ORAL_TABLET | Freq: Two times a day (BID) | ORAL | 1 refills | Status: DC
  Filled 2023-03-07: qty 180, 90d supply, fill #0
  Filled 2023-04-03 – 2023-05-20 (×2): qty 180, 90d supply, fill #1

## 2023-03-07 MED ORDER — ROSUVASTATIN CALCIUM 10 MG PO TABS
10.0000 mg | ORAL_TABLET | Freq: Every day | ORAL | 0 refills | Status: DC
  Filled 2023-03-07: qty 90, 90d supply, fill #0

## 2023-03-07 MED ORDER — BUMETANIDE 1 MG PO TABS
1.0000 mg | ORAL_TABLET | Freq: Every day | ORAL | 0 refills | Status: DC
  Filled 2023-03-07: qty 90, 90d supply, fill #0

## 2023-03-24 ENCOUNTER — Other Ambulatory Visit (HOSPITAL_COMMUNITY): Payer: Self-pay

## 2023-04-03 ENCOUNTER — Other Ambulatory Visit (HOSPITAL_COMMUNITY): Payer: Self-pay

## 2023-04-04 ENCOUNTER — Other Ambulatory Visit (HOSPITAL_BASED_OUTPATIENT_CLINIC_OR_DEPARTMENT_OTHER): Payer: Self-pay

## 2023-04-04 ENCOUNTER — Other Ambulatory Visit: Payer: Self-pay

## 2023-04-04 MED ORDER — TRESIBA FLEXTOUCH 200 UNIT/ML ~~LOC~~ SOPN
72.0000 [IU] | PEN_INJECTOR | Freq: Every day | SUBCUTANEOUS | 0 refills | Status: DC
  Filled 2023-04-04: qty 30, 83d supply, fill #0

## 2023-04-04 MED ORDER — DULOXETINE HCL 60 MG PO CPEP
60.0000 mg | ORAL_CAPSULE | Freq: Every day | ORAL | 0 refills | Status: DC
  Filled 2023-04-04: qty 90, 90d supply, fill #0

## 2023-04-20 ENCOUNTER — Other Ambulatory Visit: Payer: Self-pay

## 2023-04-20 ENCOUNTER — Other Ambulatory Visit (HOSPITAL_COMMUNITY): Payer: Self-pay

## 2023-04-20 MED ORDER — FREESTYLE LIBRE 3 PLUS SENSOR MISC
11 refills | Status: AC
  Filled 2023-04-20: qty 6, 84d supply, fill #0
  Filled 2023-06-30: qty 6, 84d supply, fill #1
  Filled 2023-10-24: qty 6, 84d supply, fill #2
  Filled 2024-01-14: qty 6, 84d supply, fill #3
  Filled 2024-04-05: qty 6, 84d supply, fill #4

## 2023-05-12 ENCOUNTER — Other Ambulatory Visit (HOSPITAL_COMMUNITY): Payer: Self-pay

## 2023-05-12 ENCOUNTER — Other Ambulatory Visit: Payer: Self-pay

## 2023-05-12 MED ORDER — HUMALOG KWIKPEN 100 UNIT/ML ~~LOC~~ SOPN
24.0000 [IU] | PEN_INJECTOR | Freq: Every day | SUBCUTANEOUS | 1 refills | Status: DC
  Filled 2023-05-12: qty 15, 62d supply, fill #0
  Filled 2023-07-17: qty 15, 62d supply, fill #1

## 2023-05-15 ENCOUNTER — Other Ambulatory Visit: Payer: Self-pay

## 2023-05-22 ENCOUNTER — Other Ambulatory Visit: Payer: Self-pay

## 2023-05-24 ENCOUNTER — Other Ambulatory Visit: Payer: Self-pay

## 2023-05-25 ENCOUNTER — Other Ambulatory Visit: Payer: Self-pay

## 2023-05-30 ENCOUNTER — Other Ambulatory Visit: Payer: Self-pay

## 2023-05-30 ENCOUNTER — Other Ambulatory Visit (HOSPITAL_COMMUNITY): Payer: Self-pay

## 2023-05-30 MED ORDER — BUMETANIDE 1 MG PO TABS
1.0000 mg | ORAL_TABLET | Freq: Every day | ORAL | 0 refills | Status: DC | PRN
  Filled 2023-05-30: qty 90, 90d supply, fill #0

## 2023-05-30 MED ORDER — ROSUVASTATIN CALCIUM 10 MG PO TABS
10.0000 mg | ORAL_TABLET | Freq: Every day | ORAL | 0 refills | Status: DC
  Filled 2023-05-30: qty 90, 90d supply, fill #0

## 2023-06-01 ENCOUNTER — Other Ambulatory Visit (HOSPITAL_COMMUNITY): Payer: Self-pay

## 2023-06-08 ENCOUNTER — Other Ambulatory Visit (HOSPITAL_COMMUNITY): Payer: Self-pay

## 2023-06-14 ENCOUNTER — Other Ambulatory Visit (HOSPITAL_COMMUNITY): Payer: Self-pay

## 2023-06-26 ENCOUNTER — Other Ambulatory Visit (HOSPITAL_COMMUNITY): Payer: Self-pay

## 2023-06-26 MED ORDER — CYCLOBENZAPRINE HCL 5 MG PO TABS
5.0000 mg | ORAL_TABLET | Freq: Three times a day (TID) | ORAL | 0 refills | Status: DC | PRN
  Filled 2023-06-26: qty 90, 30d supply, fill #0

## 2023-06-27 ENCOUNTER — Other Ambulatory Visit (HOSPITAL_COMMUNITY): Payer: Self-pay

## 2023-06-27 ENCOUNTER — Other Ambulatory Visit: Payer: Self-pay

## 2023-06-27 MED ORDER — METOPROLOL TARTRATE 50 MG PO TABS
75.0000 mg | ORAL_TABLET | Freq: Two times a day (BID) | ORAL | 1 refills | Status: DC
  Filled 2023-06-27: qty 270, 90d supply, fill #0
  Filled 2023-09-28: qty 270, 90d supply, fill #1

## 2023-06-28 ENCOUNTER — Other Ambulatory Visit: Payer: Self-pay

## 2023-06-29 ENCOUNTER — Other Ambulatory Visit (HOSPITAL_COMMUNITY): Payer: Self-pay

## 2023-06-29 ENCOUNTER — Other Ambulatory Visit: Payer: Self-pay

## 2023-06-29 MED ORDER — DOXAZOSIN MESYLATE 8 MG PO TABS
8.0000 mg | ORAL_TABLET | Freq: Every day | ORAL | 1 refills | Status: DC
  Filled 2023-06-29: qty 90, 90d supply, fill #0
  Filled 2023-09-25: qty 90, 90d supply, fill #1

## 2023-06-29 MED ORDER — DULOXETINE HCL 60 MG PO CPEP
60.0000 mg | ORAL_CAPSULE | Freq: Every day | ORAL | 0 refills | Status: DC
  Filled 2023-06-29: qty 90, 90d supply, fill #0

## 2023-06-30 ENCOUNTER — Other Ambulatory Visit (HOSPITAL_COMMUNITY): Payer: Self-pay

## 2023-06-30 ENCOUNTER — Other Ambulatory Visit: Payer: Self-pay

## 2023-06-30 MED ORDER — VERAPAMIL HCL ER 180 MG PO TBCR
180.0000 mg | EXTENDED_RELEASE_TABLET | Freq: Two times a day (BID) | ORAL | 1 refills | Status: DC
  Filled 2023-06-30: qty 180, 90d supply, fill #0
  Filled 2023-09-28: qty 180, 90d supply, fill #1

## 2023-07-08 ENCOUNTER — Other Ambulatory Visit (HOSPITAL_COMMUNITY): Payer: Self-pay

## 2023-07-17 ENCOUNTER — Other Ambulatory Visit (HOSPITAL_COMMUNITY): Payer: Self-pay

## 2023-07-18 ENCOUNTER — Other Ambulatory Visit: Payer: Self-pay

## 2023-07-18 ENCOUNTER — Other Ambulatory Visit (HOSPITAL_COMMUNITY): Payer: Self-pay

## 2023-07-19 ENCOUNTER — Other Ambulatory Visit (HOSPITAL_COMMUNITY): Payer: Self-pay

## 2023-07-20 ENCOUNTER — Other Ambulatory Visit (HOSPITAL_COMMUNITY): Payer: Self-pay

## 2023-07-21 ENCOUNTER — Other Ambulatory Visit (HOSPITAL_COMMUNITY): Payer: Self-pay

## 2023-07-25 ENCOUNTER — Other Ambulatory Visit (HOSPITAL_COMMUNITY): Payer: Self-pay

## 2023-07-25 MED ORDER — ESTRADIOL 0.1 MG/GM VA CREA
1.0000 g | TOPICAL_CREAM | Freq: Every evening | VAGINAL | 5 refills | Status: AC
  Filled 2023-07-25 – 2023-09-28 (×5): qty 42.5, 90d supply, fill #0
  Filled 2023-12-22: qty 42.5, 90d supply, fill #1

## 2023-07-25 MED ORDER — AMOXICILLIN-POT CLAVULANATE 875-125 MG PO TABS
1.0000 | ORAL_TABLET | Freq: Two times a day (BID) | ORAL | 0 refills | Status: AC
Start: 2023-07-24 — End: ?
  Filled 2023-07-25 – 2023-08-27 (×2): qty 14, 7d supply, fill #0

## 2023-07-26 ENCOUNTER — Other Ambulatory Visit (HOSPITAL_COMMUNITY): Payer: Self-pay

## 2023-07-27 ENCOUNTER — Other Ambulatory Visit (HOSPITAL_COMMUNITY): Payer: Self-pay

## 2023-07-27 ENCOUNTER — Other Ambulatory Visit: Payer: Self-pay

## 2023-07-27 MED ORDER — NITROFURANTOIN MONOHYD MACRO 100 MG PO CAPS
100.0000 mg | ORAL_CAPSULE | Freq: Two times a day (BID) | ORAL | 0 refills | Status: AC
Start: 2023-07-27 — End: 2023-08-07
  Filled 2023-07-27: qty 20, 10d supply, fill #0

## 2023-08-01 ENCOUNTER — Other Ambulatory Visit (HOSPITAL_COMMUNITY): Payer: Self-pay

## 2023-08-01 ENCOUNTER — Other Ambulatory Visit: Payer: Self-pay

## 2023-08-01 ENCOUNTER — Encounter: Payer: Self-pay | Admitting: Pharmacist

## 2023-08-02 ENCOUNTER — Other Ambulatory Visit: Payer: Self-pay

## 2023-08-02 ENCOUNTER — Other Ambulatory Visit (HOSPITAL_COMMUNITY): Payer: Self-pay

## 2023-08-02 MED ORDER — CYCLOBENZAPRINE HCL 5 MG PO TABS
5.0000 mg | ORAL_TABLET | Freq: Three times a day (TID) | ORAL | 0 refills | Status: AC | PRN
  Filled 2023-08-02: qty 90, 30d supply, fill #0

## 2023-08-02 MED ORDER — ZOLPIDEM TARTRATE ER 6.25 MG PO TBCR
6.2500 mg | EXTENDED_RELEASE_TABLET | Freq: Every evening | ORAL | 0 refills | Status: AC
  Filled 2023-08-02: qty 30, 30d supply, fill #0

## 2023-08-02 MED ORDER — MOUNJARO 12.5 MG/0.5ML ~~LOC~~ SOAJ
12.5000 mg | SUBCUTANEOUS | 1 refills | Status: DC
  Filled 2023-08-17: qty 6, 84d supply, fill #0
  Filled 2023-11-09: qty 6, 84d supply, fill #1

## 2023-08-03 ENCOUNTER — Other Ambulatory Visit (HOSPITAL_COMMUNITY): Payer: Self-pay

## 2023-08-17 ENCOUNTER — Other Ambulatory Visit: Payer: Self-pay

## 2023-08-17 ENCOUNTER — Other Ambulatory Visit (HOSPITAL_COMMUNITY): Payer: Self-pay

## 2023-08-27 ENCOUNTER — Other Ambulatory Visit (HOSPITAL_COMMUNITY): Payer: Self-pay

## 2023-08-28 ENCOUNTER — Other Ambulatory Visit (HOSPITAL_COMMUNITY): Payer: Self-pay

## 2023-08-28 ENCOUNTER — Other Ambulatory Visit: Payer: Self-pay

## 2023-08-28 MED ORDER — CLONIDINE HCL 0.1 MG PO TABS
0.1000 mg | ORAL_TABLET | Freq: Two times a day (BID) | ORAL | 1 refills | Status: DC
  Filled 2023-08-28: qty 180, 90d supply, fill #0
  Filled 2023-11-22: qty 180, 90d supply, fill #1

## 2023-08-28 MED ORDER — ROSUVASTATIN CALCIUM 10 MG PO TABS
10.0000 mg | ORAL_TABLET | Freq: Every day | ORAL | 0 refills | Status: DC
  Filled 2023-08-28: qty 90, 90d supply, fill #0

## 2023-08-30 ENCOUNTER — Other Ambulatory Visit (HOSPITAL_COMMUNITY): Payer: Self-pay

## 2023-08-31 ENCOUNTER — Other Ambulatory Visit (HOSPITAL_COMMUNITY): Payer: Self-pay

## 2023-09-12 ENCOUNTER — Other Ambulatory Visit: Payer: Self-pay

## 2023-09-12 ENCOUNTER — Other Ambulatory Visit (HOSPITAL_COMMUNITY): Payer: Self-pay

## 2023-09-13 ENCOUNTER — Other Ambulatory Visit (HOSPITAL_COMMUNITY): Payer: Self-pay

## 2023-09-13 MED ORDER — HUMALOG KWIKPEN 100 UNIT/ML ~~LOC~~ SOPN
PEN_INJECTOR | SUBCUTANEOUS | 3 refills | Status: AC
  Filled 2023-09-13: qty 15, 62d supply, fill #0
  Filled 2023-11-09: qty 15, 62d supply, fill #1
  Filled 2024-01-10: qty 15, 62d supply, fill #2
  Filled 2024-03-16: qty 15, 62d supply, fill #3

## 2023-09-25 ENCOUNTER — Other Ambulatory Visit: Payer: Self-pay

## 2023-09-25 ENCOUNTER — Other Ambulatory Visit (HOSPITAL_COMMUNITY): Payer: Self-pay

## 2023-09-25 MED ORDER — DULOXETINE HCL 60 MG PO CPEP
60.0000 mg | ORAL_CAPSULE | Freq: Every day | ORAL | 0 refills | Status: DC
  Filled 2023-09-25: qty 90, 90d supply, fill #0

## 2023-09-26 ENCOUNTER — Other Ambulatory Visit: Payer: Self-pay

## 2023-09-28 ENCOUNTER — Other Ambulatory Visit: Payer: Self-pay

## 2023-09-28 ENCOUNTER — Other Ambulatory Visit (HOSPITAL_COMMUNITY): Payer: Self-pay

## 2023-10-24 ENCOUNTER — Other Ambulatory Visit (HOSPITAL_COMMUNITY): Payer: Self-pay

## 2023-10-25 ENCOUNTER — Other Ambulatory Visit (HOSPITAL_COMMUNITY): Payer: Self-pay

## 2023-10-25 ENCOUNTER — Other Ambulatory Visit: Payer: Self-pay

## 2023-10-25 MED ORDER — ALPRAZOLAM 0.5 MG PO TABS
0.5000 mg | ORAL_TABLET | Freq: Every evening | ORAL | 0 refills | Status: AC | PRN
  Filled 2023-10-25: qty 90, 90d supply, fill #0

## 2023-10-30 ENCOUNTER — Other Ambulatory Visit (HOSPITAL_COMMUNITY): Payer: Self-pay

## 2023-10-30 MED ORDER — FOSFOMYCIN TROMETHAMINE 3 G PO PACK
3.0000 g | PACK | ORAL | 0 refills | Status: AC
  Filled 2023-10-30: qty 3, 9d supply, fill #0

## 2023-10-31 ENCOUNTER — Other Ambulatory Visit: Payer: Self-pay

## 2023-10-31 ENCOUNTER — Other Ambulatory Visit (HOSPITAL_COMMUNITY): Payer: Self-pay

## 2023-11-01 ENCOUNTER — Other Ambulatory Visit (HOSPITAL_COMMUNITY): Payer: Self-pay

## 2023-11-01 ENCOUNTER — Encounter (HOSPITAL_COMMUNITY): Payer: Self-pay

## 2023-11-01 ENCOUNTER — Other Ambulatory Visit: Payer: Self-pay

## 2023-11-01 MED ORDER — METHENAMINE HIPPURATE 1 G PO TABS
1.0000 g | ORAL_TABLET | Freq: Every day | ORAL | 0 refills | Status: DC
  Filled 2023-11-01 (×2): qty 30, 30d supply, fill #0

## 2023-11-03 ENCOUNTER — Other Ambulatory Visit (HOSPITAL_COMMUNITY): Payer: Self-pay

## 2023-11-03 MED ORDER — CARVEDILOL 12.5 MG PO TABS
ORAL_TABLET | ORAL | 3 refills | Status: AC
  Filled 2023-11-03: qty 270, 90d supply, fill #0
  Filled 2024-01-30: qty 270, 90d supply, fill #1

## 2023-11-09 ENCOUNTER — Other Ambulatory Visit (HOSPITAL_COMMUNITY): Payer: Self-pay

## 2023-11-22 ENCOUNTER — Other Ambulatory Visit: Payer: Self-pay

## 2023-12-17 ENCOUNTER — Other Ambulatory Visit (HOSPITAL_COMMUNITY): Payer: Self-pay

## 2023-12-18 ENCOUNTER — Other Ambulatory Visit: Payer: Self-pay

## 2023-12-18 ENCOUNTER — Other Ambulatory Visit (HOSPITAL_COMMUNITY): Payer: Self-pay

## 2023-12-18 MED ORDER — ALBUTEROL SULFATE HFA 108 (90 BASE) MCG/ACT IN AERS
2.0000 | INHALATION_SPRAY | RESPIRATORY_TRACT | 10 refills | Status: AC | PRN
  Filled 2023-12-18: qty 6.7, 17d supply, fill #0

## 2023-12-18 MED ORDER — ROSUVASTATIN CALCIUM 10 MG PO TABS
10.0000 mg | ORAL_TABLET | Freq: Every day | ORAL | 0 refills | Status: DC
  Filled 2023-12-18: qty 90, 90d supply, fill #0

## 2023-12-18 MED ORDER — METHENAMINE HIPPURATE 1 G PO TABS
1.0000 g | ORAL_TABLET | Freq: Every day | ORAL | 0 refills | Status: DC
  Filled 2023-12-18: qty 30, 30d supply, fill #0

## 2023-12-18 MED ORDER — COMFORT EZ PEN NEEDLES 32G X 5 MM MISC
1 refills | Status: AC
  Filled 2023-12-18: qty 100, 90d supply, fill #0
  Filled 2024-03-16: qty 100, 90d supply, fill #1

## 2023-12-19 ENCOUNTER — Other Ambulatory Visit: Payer: Self-pay

## 2023-12-22 ENCOUNTER — Other Ambulatory Visit: Payer: Self-pay

## 2023-12-22 ENCOUNTER — Other Ambulatory Visit (HOSPITAL_COMMUNITY): Payer: Self-pay

## 2023-12-22 MED ORDER — DOXAZOSIN MESYLATE 8 MG PO TABS
8.0000 mg | ORAL_TABLET | Freq: Every day | ORAL | 1 refills | Status: AC
  Filled 2023-12-22: qty 90, 90d supply, fill #0
  Filled 2024-01-10 – 2024-03-20 (×2): qty 90, 90d supply, fill #1

## 2023-12-23 ENCOUNTER — Other Ambulatory Visit (HOSPITAL_COMMUNITY): Payer: Self-pay

## 2023-12-24 ENCOUNTER — Other Ambulatory Visit (HOSPITAL_COMMUNITY): Payer: Self-pay

## 2024-01-10 ENCOUNTER — Other Ambulatory Visit (HOSPITAL_COMMUNITY): Payer: Self-pay

## 2024-01-11 ENCOUNTER — Encounter (HOSPITAL_COMMUNITY): Payer: Self-pay

## 2024-01-11 ENCOUNTER — Other Ambulatory Visit: Payer: Self-pay

## 2024-01-11 ENCOUNTER — Other Ambulatory Visit (HOSPITAL_COMMUNITY): Payer: Self-pay

## 2024-01-11 MED ORDER — DULOXETINE HCL 60 MG PO CPEP
60.0000 mg | ORAL_CAPSULE | Freq: Every day | ORAL | 0 refills | Status: DC
  Filled 2024-01-11: qty 90, 90d supply, fill #0

## 2024-01-11 MED ORDER — VERAPAMIL HCL ER 180 MG PO TBCR
180.0000 mg | EXTENDED_RELEASE_TABLET | Freq: Two times a day (BID) | ORAL | 1 refills | Status: AC
  Filled 2024-01-11: qty 180, 90d supply, fill #0
  Filled 2024-04-05: qty 180, 90d supply, fill #1

## 2024-01-11 MED ORDER — METHENAMINE HIPPURATE 1 G PO TABS
1.0000 g | ORAL_TABLET | Freq: Every day | ORAL | 0 refills | Status: DC
  Filled 2024-01-11: qty 30, 30d supply, fill #0

## 2024-01-15 ENCOUNTER — Other Ambulatory Visit (HOSPITAL_COMMUNITY): Payer: Self-pay

## 2024-01-30 ENCOUNTER — Other Ambulatory Visit: Payer: Self-pay

## 2024-01-30 ENCOUNTER — Other Ambulatory Visit (HOSPITAL_COMMUNITY): Payer: Self-pay

## 2024-01-30 MED ORDER — METHENAMINE HIPPURATE 1 G PO TABS
1.0000 g | ORAL_TABLET | Freq: Every day | ORAL | 5 refills | Status: AC
  Filled 2024-01-30 – 2024-02-03 (×2): qty 30, 30d supply, fill #0
  Filled 2024-03-16: qty 30, 30d supply, fill #1
  Filled 2024-04-18: qty 90, 90d supply, fill #2

## 2024-01-30 MED ORDER — TRESIBA FLEXTOUCH 200 UNIT/ML ~~LOC~~ SOPN
72.0000 [IU] | PEN_INJECTOR | Freq: Every day | SUBCUTANEOUS | 0 refills | Status: DC
  Filled 2024-01-30: qty 30, 83d supply, fill #0

## 2024-02-03 ENCOUNTER — Other Ambulatory Visit (HOSPITAL_COMMUNITY): Payer: Self-pay

## 2024-02-05 ENCOUNTER — Other Ambulatory Visit (HOSPITAL_COMMUNITY): Payer: Self-pay

## 2024-02-18 ENCOUNTER — Other Ambulatory Visit (HOSPITAL_COMMUNITY): Payer: Self-pay

## 2024-02-19 ENCOUNTER — Other Ambulatory Visit (HOSPITAL_COMMUNITY): Payer: Self-pay

## 2024-02-19 MED ORDER — CLONIDINE HCL 0.1 MG PO TABS
0.1000 mg | ORAL_TABLET | Freq: Two times a day (BID) | ORAL | 1 refills | Status: AC
  Filled 2024-02-19: qty 180, 90d supply, fill #0

## 2024-03-16 ENCOUNTER — Other Ambulatory Visit (HOSPITAL_COMMUNITY): Payer: Self-pay

## 2024-03-17 ENCOUNTER — Other Ambulatory Visit (HOSPITAL_COMMUNITY): Payer: Self-pay

## 2024-03-18 ENCOUNTER — Other Ambulatory Visit: Payer: Self-pay

## 2024-03-18 ENCOUNTER — Other Ambulatory Visit (HOSPITAL_COMMUNITY): Payer: Self-pay

## 2024-03-18 MED ORDER — ROSUVASTATIN CALCIUM 10 MG PO TABS
10.0000 mg | ORAL_TABLET | Freq: Every day | ORAL | 0 refills | Status: AC
  Filled 2024-03-18: qty 90, 90d supply, fill #0

## 2024-03-20 ENCOUNTER — Other Ambulatory Visit: Payer: Self-pay

## 2024-03-20 ENCOUNTER — Other Ambulatory Visit (HOSPITAL_COMMUNITY): Payer: Self-pay

## 2024-04-03 ENCOUNTER — Other Ambulatory Visit (HOSPITAL_COMMUNITY): Payer: Self-pay

## 2024-04-03 MED ORDER — DULOXETINE HCL 60 MG PO CPEP
60.0000 mg | ORAL_CAPSULE | Freq: Every day | ORAL | 0 refills | Status: AC
  Filled 2024-04-03: qty 90, 90d supply, fill #0

## 2024-04-04 ENCOUNTER — Other Ambulatory Visit (HOSPITAL_COMMUNITY): Payer: Self-pay

## 2024-04-05 ENCOUNTER — Other Ambulatory Visit: Payer: Self-pay

## 2024-04-20 ENCOUNTER — Other Ambulatory Visit (HOSPITAL_COMMUNITY): Payer: Self-pay

## 2024-04-21 ENCOUNTER — Other Ambulatory Visit (HOSPITAL_COMMUNITY): Payer: Self-pay

## 2024-04-21 MED ORDER — METHENAMINE HIPPURATE 1 G PO TABS
1.0000 g | ORAL_TABLET | Freq: Every day | ORAL | 0 refills | Status: AC
  Filled 2024-04-21: qty 90, 90d supply, fill #0

## 2024-04-22 ENCOUNTER — Other Ambulatory Visit (HOSPITAL_COMMUNITY): Payer: Self-pay

## 2024-04-23 ENCOUNTER — Other Ambulatory Visit (HOSPITAL_COMMUNITY): Payer: Self-pay

## 2024-04-23 MED ORDER — MOUNJARO 12.5 MG/0.5ML ~~LOC~~ SOAJ
12.5000 mg | SUBCUTANEOUS | 1 refills | Status: AC
  Filled 2024-04-23: qty 6, 84d supply, fill #0

## 2024-04-23 MED ORDER — TRESIBA FLEXTOUCH 200 UNIT/ML ~~LOC~~ SOPN
72.0000 [IU] | PEN_INJECTOR | Freq: Every day | SUBCUTANEOUS | 0 refills | Status: AC
  Filled 2024-04-23: qty 30, 83d supply, fill #0

## 2024-04-23 MED ORDER — BUMETANIDE 1 MG PO TABS
ORAL_TABLET | ORAL | 0 refills | Status: AC
  Filled 2024-04-23: qty 90, 90d supply, fill #0

## 2024-04-24 ENCOUNTER — Other Ambulatory Visit (HOSPITAL_COMMUNITY): Payer: Self-pay

## 2024-04-24 ENCOUNTER — Other Ambulatory Visit: Payer: Self-pay

## 2024-04-25 ENCOUNTER — Other Ambulatory Visit: Payer: Self-pay

## 2024-04-25 ENCOUNTER — Encounter: Payer: Self-pay | Admitting: Pharmacist

## 2024-04-25 ENCOUNTER — Other Ambulatory Visit (HOSPITAL_COMMUNITY): Payer: Self-pay

## 2024-04-26 ENCOUNTER — Other Ambulatory Visit (HOSPITAL_COMMUNITY): Payer: Self-pay

## 2024-04-26 ENCOUNTER — Other Ambulatory Visit: Payer: Self-pay
